# Patient Record
Sex: Male | Born: 1950
Health system: Southern US, Community
[De-identification: ages and names within clinical notes are randomized; demographics above are authoritative.]

## PROBLEM LIST (undated history)

## (undated) DIAGNOSIS — R059 Cough, unspecified: Secondary | ICD-10-CM

## (undated) DIAGNOSIS — C801 Malignant (primary) neoplasm, unspecified: Secondary | ICD-10-CM

## (undated) DIAGNOSIS — R05 Cough: Secondary | ICD-10-CM

## (undated) DIAGNOSIS — R0602 Shortness of breath: Secondary | ICD-10-CM

## (undated) DIAGNOSIS — J189 Pneumonia, unspecified organism: Secondary | ICD-10-CM

## (undated) HISTORY — PX: INTRAOCULAR LENS IMPLANT, SECONDARY: SHX1842

## (undated) HISTORY — PX: SHOULDER SURGERY: SHX246

## (undated) HISTORY — PX: CATARACT EXTRACTION: SUR2

## (undated) HISTORY — PX: KNEE SURGERY: SHX244

## (undated) HISTORY — PX: COLON SURGERY: SHX602

---

## 1971-11-28 HISTORY — PX: KNEE SURGERY: SHX244

## 1998-11-19 ENCOUNTER — Encounter: Payer: Self-pay | Admitting: Emergency Medicine

## 1998-11-19 ENCOUNTER — Emergency Department (HOSPITAL_COMMUNITY): Admission: EM | Admit: 1998-11-19 | Discharge: 1998-11-19 | Payer: Self-pay | Admitting: Emergency Medicine

## 1999-04-02 ENCOUNTER — Emergency Department (HOSPITAL_COMMUNITY): Admission: EM | Admit: 1999-04-02 | Discharge: 1999-04-02 | Payer: Self-pay | Admitting: Emergency Medicine

## 1999-04-04 ENCOUNTER — Encounter (HOSPITAL_COMMUNITY): Admission: RE | Admit: 1999-04-04 | Discharge: 1999-07-03 | Payer: Self-pay | Admitting: Psychiatry

## 1999-06-12 ENCOUNTER — Inpatient Hospital Stay (HOSPITAL_COMMUNITY): Admission: EM | Admit: 1999-06-12 | Discharge: 1999-06-15 | Payer: Self-pay | Admitting: Emergency Medicine

## 1999-07-22 ENCOUNTER — Inpatient Hospital Stay (HOSPITAL_COMMUNITY): Admission: EM | Admit: 1999-07-22 | Discharge: 1999-07-26 | Payer: Self-pay | Admitting: Emergency Medicine

## 2001-12-26 ENCOUNTER — Ambulatory Visit (HOSPITAL_COMMUNITY): Admission: RE | Admit: 2001-12-26 | Discharge: 2001-12-26 | Payer: Self-pay | Admitting: Neurology

## 2002-01-01 ENCOUNTER — Encounter: Admission: RE | Admit: 2002-01-01 | Discharge: 2002-01-01 | Payer: Self-pay | Admitting: Neurology

## 2002-01-01 ENCOUNTER — Encounter: Payer: Self-pay | Admitting: Neurology

## 2007-11-28 HISTORY — PX: SHOULDER SURGERY: SHX246

## 2011-10-28 DIAGNOSIS — J189 Pneumonia, unspecified organism: Secondary | ICD-10-CM

## 2011-10-28 HISTORY — DX: Pneumonia, unspecified organism: J18.9

## 2011-11-24 ENCOUNTER — Emergency Department (HOSPITAL_COMMUNITY)
Admission: EM | Admit: 2011-11-24 | Discharge: 2011-11-24 | Disposition: A | Discharge status: Home / Self Care | Payer: Self-pay | Attending: Emergency Medicine | Admitting: Emergency Medicine

## 2011-11-24 ENCOUNTER — Other Ambulatory Visit: Payer: Self-pay

## 2011-11-24 ENCOUNTER — Emergency Department (HOSPITAL_COMMUNITY): Payer: Self-pay

## 2011-11-24 DIAGNOSIS — R0602 Shortness of breath: Secondary | ICD-10-CM | POA: Insufficient documentation

## 2011-11-24 DIAGNOSIS — R079 Chest pain, unspecified: Secondary | ICD-10-CM | POA: Insufficient documentation

## 2011-11-24 DIAGNOSIS — R05 Cough: Secondary | ICD-10-CM | POA: Insufficient documentation

## 2011-11-24 DIAGNOSIS — J40 Bronchitis, not specified as acute or chronic: Secondary | ICD-10-CM

## 2011-11-24 DIAGNOSIS — R059 Cough, unspecified: Secondary | ICD-10-CM | POA: Insufficient documentation

## 2011-11-24 DIAGNOSIS — F17200 Nicotine dependence, unspecified, uncomplicated: Secondary | ICD-10-CM | POA: Insufficient documentation

## 2011-11-24 DIAGNOSIS — Z79899 Other long term (current) drug therapy: Secondary | ICD-10-CM | POA: Insufficient documentation

## 2011-11-24 MED ORDER — PREDNISONE 10 MG PO TABS: 20.0000 mg | ORAL_TABLET | Freq: Every day | ORAL | Status: DC

## 2011-11-24 MED ORDER — IPRATROPIUM BROMIDE 0.02 % IN SOLN
0.5000 mg | Freq: Once | RESPIRATORY_TRACT | Status: AC
  Administered 2011-11-24: 0.5 mg via RESPIRATORY_TRACT
  Filled 2011-11-24: qty 2.5

## 2011-11-24 MED ORDER — HYDROCOD POLST-CHLORPHEN POLST 10-8 MG/5ML PO LQCR: 5.0000 mL | Freq: Two times a day (BID) | ORAL | Status: DC | PRN

## 2011-11-24 MED ORDER — ALBUTEROL SULFATE HFA 108 (90 BASE) MCG/ACT IN AERS: 1.0000 | INHALATION_SPRAY | Freq: Four times a day (QID) | RESPIRATORY_TRACT | Status: DC | PRN

## 2011-11-24 MED ORDER — ALBUTEROL SULFATE (5 MG/ML) 0.5% IN NEBU
5.0000 mg | INHALATION_SOLUTION | Freq: Once | RESPIRATORY_TRACT | Status: AC
  Administered 2011-11-24: 5 mg via RESPIRATORY_TRACT
  Filled 2011-11-24: qty 1

## 2011-11-24 MED ORDER — PREDNISONE 20 MG PO TABS
60.0000 mg | ORAL_TABLET | Freq: Once | ORAL | Status: AC
  Administered 2011-11-24: 60 mg via ORAL
  Filled 2011-11-24: qty 3

## 2011-11-24 MED ORDER — MOXIFLOXACIN HCL 400 MG PO TABS: 400.0000 mg | ORAL_TABLET | Freq: Every day | ORAL | Status: AC

## 2011-11-24 NOTE — ED Notes (Signed)
RT in giving breathing treatment

## 2011-11-24 NOTE — ED Notes (Signed)
[id number]<AV> Expected date:<BR> Expected time:<BR> Means of arrival:<BR> Comments:<BR> Hold for triage RM 4

## 2011-11-24 NOTE — ED Notes (Signed)
Patient transported to X-ray 

## 2011-11-24 NOTE — ED Provider Notes (Signed)
History     CSN: [id number]  Arrival date & time 11/24/11  1814   First MD Initiated Contact with Patient 11/24/11 1927      Chief Complaint  Patient presents with  . Shortness of Breath    since sunday.  had fevers/chills.  . Cough    dry cough  . Chest Pain    burning and tightness.    (Consider location/radiation/quality/duration/timing/severity/associated sxs/prior treatment) The history is provided by the patient.   patient here with cough x5 days. Cough has been productive of green sputum. Low-grade temperature noted. Denies vomiting or diarrhea. Patient has been using over-the-counter medications without resolution of his symptoms. Cough is worse with exertion and associated with chest congestion however he denies any anginal type chest pain. History reviewed. No pertinent past medical history.  Past Surgical History  Procedure Date  . Knee surgery   . Shoulder surgery   . Cataract extraction   . Intraocular lens implant, secondary     History reviewed. No pertinent family history.  History  Substance Use Topics  . Smoking status: Current Everyday Smoker -- 2.0 packs/day for 40 years    Types: Cigars  . Smokeless tobacco: Not on file  . Alcohol Use: No      Review of Systems  All other systems reviewed and are negative.    Allergies  Review of patient's allergies indicates no known allergies.  Home Medications   Current Outpatient Rx  Name Route Sig Dispense Refill  . GUAIFENESIN ER 600 MG PO TB12 Oral Take 1,200 mg by mouth 2 (two) times daily.      . GUAIFENESIN 100 MG/5ML PO LIQD Oral Take 400 mg by mouth 3 (three) times daily as needed.      . ONE-A-DAY MENS 50+ ADVANTAGE PO Oral Take 1 tablet by mouth.      . TYLENOL COLD/FLU SEVERE DAY PO Oral Take 2 capsules by mouth.        BP 154/96  Pulse 103  Temp(Src) 98.8 F (37.1 C) (Oral)  Resp 24  Ht 5\' 8"  (1.727 m)  Wt 140 lb (63.504 kg)  BMI 21.29 kg/m2  SpO2 92%  Physical Exam    Nursing note and vitals reviewed. Constitutional: He is oriented to person, place, and time. He appears well-developed and well-nourished.  Non-toxic appearance. No distress.  HENT:  Head: Normocephalic and atraumatic.  Eyes: Conjunctivae, EOM and lids are normal. Pupils are equal, round, and reactive to light.  Neck: Normal range of motion. Neck supple. No tracheal deviation present. No mass present.  Cardiovascular: Regular rhythm and normal heart sounds.  Tachycardia present.  Exam reveals no gallop.   No murmur heard. Pulmonary/Chest: Effort normal. No stridor. No respiratory distress. He has decreased breath sounds. He has wheezes. He has no rhonchi. He has no rales.  Abdominal: Soft. Normal appearance and bowel sounds are normal. He exhibits no distension. There is no tenderness. There is no rebound and no CVA tenderness.  Musculoskeletal: Normal range of motion. He exhibits no edema and no tenderness.  Neurological: He is alert and oriented to person, place, and time. He has normal strength. No cranial nerve deficit or sensory deficit. GCS eye subscore is 4. GCS verbal subscore is 5. GCS motor subscore is 6.  Skin: Skin is warm and dry. No abrasion and no rash noted.  Psychiatric: He has a normal mood and affect. His speech is normal and behavior is normal.    ED Course  Procedures (including  critical care time)  Labs Reviewed - No data to display No results found.   No diagnosis found.    MDM  Patient given albuterol with Atrovent. Chest x-ray noted. Will treat patient for bronchitis/early pneumonia        Toy Baker, MD 11/24/11 2049

## 2012-04-15 ENCOUNTER — Emergency Department (HOSPITAL_COMMUNITY): Payer: Self-pay | Admitting: Anesthesiology

## 2012-04-15 ENCOUNTER — Encounter (HOSPITAL_COMMUNITY): Payer: Self-pay | Admitting: Emergency Medicine

## 2012-04-15 ENCOUNTER — Encounter (HOSPITAL_COMMUNITY)
Admission: EM | Disposition: A | Discharge status: Home Health | Payer: Self-pay | Source: Home / Self Care | Attending: General Surgery

## 2012-04-15 ENCOUNTER — Encounter (HOSPITAL_COMMUNITY): Payer: Self-pay | Admitting: Anesthesiology

## 2012-04-15 ENCOUNTER — Emergency Department (HOSPITAL_COMMUNITY): Payer: Self-pay

## 2012-04-15 ENCOUNTER — Inpatient Hospital Stay (HOSPITAL_COMMUNITY)
Admission: EM | Admit: 2012-04-15 | Discharge: 2012-04-22 | DRG: 331 | Disposition: A | Discharge status: Home Health | Payer: Self-pay | Attending: General Surgery | Admitting: General Surgery

## 2012-04-15 DIAGNOSIS — K631 Perforation of intestine (nontraumatic): Secondary | ICD-10-CM

## 2012-04-15 DIAGNOSIS — Z832 Family history of diseases of the blood and blood-forming organs and certain disorders involving the immune mechanism: Secondary | ICD-10-CM

## 2012-04-15 DIAGNOSIS — Z8701 Personal history of pneumonia (recurrent): Secondary | ICD-10-CM

## 2012-04-15 DIAGNOSIS — J45909 Unspecified asthma, uncomplicated: Secondary | ICD-10-CM | POA: Diagnosis present

## 2012-04-15 DIAGNOSIS — K5289 Other specified noninfective gastroenteritis and colitis: Secondary | ICD-10-CM | POA: Diagnosis present

## 2012-04-15 DIAGNOSIS — K389 Disease of appendix, unspecified: Secondary | ICD-10-CM

## 2012-04-15 DIAGNOSIS — K5732 Diverticulitis of large intestine without perforation or abscess without bleeding: Secondary | ICD-10-CM

## 2012-04-15 DIAGNOSIS — K37 Unspecified appendicitis: Secondary | ICD-10-CM | POA: Diagnosis present

## 2012-04-15 HISTORY — PX: LAPAROTOMY: SHX154

## 2012-04-15 HISTORY — PX: COLOSTOMY: SHX63

## 2012-04-15 HISTORY — DX: Pneumonia, unspecified organism: J18.9

## 2012-04-15 HISTORY — PX: PARTIAL COLECTOMY: SHX5273

## 2012-04-15 HISTORY — PX: APPENDECTOMY: SHX54

## 2012-04-15 LAB — CBC
HCT: 45.7 % (ref 39.0–52.0)
Hemoglobin: 16.3 g/dL (ref 13.0–17.0)
MCH: 33.4 pg (ref 26.0–34.0)
MCHC: 35.7 g/dL (ref 30.0–36.0)
MCV: 93.6 fL (ref 78.0–100.0)
Platelets: 247 10*3/uL (ref 150–400)
RBC: 4.88 MIL/uL (ref 4.22–5.81)
RDW: 12.9 % (ref 11.5–15.5)
WBC: 8.8 10*3/uL (ref 4.0–10.5)

## 2012-04-15 LAB — COMPREHENSIVE METABOLIC PANEL
ALT: 10 U/L (ref 0–53)
AST: 16 U/L (ref 0–37)
Albumin: 3.5 g/dL (ref 3.5–5.2)
Alkaline Phosphatase: 71 U/L (ref 39–117)
BUN: 12 mg/dL (ref 6–23)
CO2: 27 mEq/L (ref 19–32)
Calcium: 8.7 mg/dL (ref 8.4–10.5)
Chloride: 97 mEq/L (ref 96–112)
Creatinine, Ser: 0.77 mg/dL (ref 0.50–1.35)
GFR calc Af Amer: >90 mL/min (ref >90)
GFR calc non Af Amer: >90 mL/min (ref >90)
Glucose, Bld: 122 mg/dL — ABNORMAL HIGH (ref 70–99)
Potassium: 4.6 mEq/L (ref 3.5–5.1)
Sodium: 135 mEq/L (ref 135–145)
Total Bilirubin: 0.5 mg/dL (ref 0.3–1.2)
Total Protein: 7 g/dL (ref 6.0–8.3)

## 2012-04-15 LAB — URINE MICROSCOPIC-ADD ON

## 2012-04-15 LAB — DIFFERENTIAL
Basophils Absolute: 0.1 10*3/uL (ref 0.0–0.1)
Basophils Relative: 1 % (ref 0–1)
Eosinophils Absolute: 0 10*3/uL (ref 0.0–0.7)
Eosinophils Relative: 0 % (ref 0–5)
Lymphocytes Relative: 10 % — ABNORMAL LOW (ref 12–46)
Lymphs Abs: 0.9 10*3/uL (ref 0.7–4.0)
Monocytes Absolute: 1 10*3/uL (ref 0.1–1.0)
Monocytes Relative: 11 % (ref 3–12)
Neutro Abs: 6.8 10*3/uL (ref 1.7–7.7)
Neutrophils Relative %: 78 % — ABNORMAL HIGH (ref 43–77)

## 2012-04-15 LAB — URINALYSIS, ROUTINE W REFLEX MICROSCOPIC
Glucose, UA: NEGATIVE mg/dL
Hgb urine dipstick: NEGATIVE
Protein, ur: NEGATIVE mg/dL
Specific Gravity, Urine: 1.024 (ref 1.005–1.030)
pH: 6 (ref 5.0–8.0)

## 2012-04-15 LAB — LIPASE, BLOOD: Lipase: 12 U/L (ref 11–59)

## 2012-04-15 SURGERY — LAPAROTOMY, EXPLORATORY
Anesthesia: General | Site: Abdomen

## 2012-04-15 MED ORDER — MORPHINE SULFATE (PF) 1 MG/ML IV SOLN
INTRAVENOUS | Status: DC
  Administered 2012-04-15: via INTRAVENOUS
  Administered 2012-04-16: 1.5 mg via INTRAVENOUS
  Administered 2012-04-16: 3 mg via INTRAVENOUS
  Administered 2012-04-16: 10.3 mg via INTRAVENOUS
  Administered 2012-04-16: 9 mg via INTRAVENOUS
  Administered 2012-04-16: 1.5 mg via INTRAVENOUS
  Administered 2012-04-17: 01:00:00 via INTRAVENOUS
  Administered 2012-04-17: 1.5 mg via INTRAVENOUS
  Administered 2012-04-17: 10 mg via INTRAVENOUS
  Administered 2012-04-17: 7.5 mg via INTRAVENOUS
  Administered 2012-04-17: 6 mg via INTRAVENOUS
  Administered 2012-04-17 (×2): 4.5 mg via INTRAVENOUS
  Administered 2012-04-18 (×2): via INTRAVENOUS
  Administered 2012-04-18: 7.5 mg via INTRAVENOUS
  Administered 2012-04-18: 6.5 mg via INTRAVENOUS
  Administered 2012-04-18 (×2): 6 mg via INTRAVENOUS
  Administered 2012-04-18: 7.5 mg via INTRAVENOUS
  Administered 2012-04-19: 4.5 mg via INTRAVENOUS
  Administered 2012-04-19: 6 mg via INTRAVENOUS
  Filled 2012-04-15 (×5): qty 25

## 2012-04-15 MED ORDER — FENTANYL CITRATE 0.05 MG/ML IJ SOLN
INTRAMUSCULAR | Status: AC
  Filled 2012-04-15: qty 2

## 2012-04-15 MED ORDER — ONDANSETRON HCL 4 MG/2ML IJ SOLN
4.0000 mg | Freq: Once | INTRAMUSCULAR | Status: AC
  Administered 2012-04-15: 4 mg via INTRAVENOUS
  Filled 2012-04-15: qty 2

## 2012-04-15 MED ORDER — LIDOCAINE HCL (CARDIAC) 20 MG/ML IV SOLN
INTRAVENOUS | Status: DC | PRN
  Administered 2012-04-15: 50 mg via INTRAVENOUS

## 2012-04-15 MED ORDER — ONDANSETRON 8 MG PO TBDP
8.0000 mg | ORAL_TABLET | Freq: Once | ORAL | Status: AC
  Administered 2012-04-15: 8 mg via ORAL
  Filled 2012-04-15: qty 1

## 2012-04-15 MED ORDER — IOHEXOL 300 MG/ML  SOLN
100.0000 mL | Freq: Once | INTRAMUSCULAR | Status: AC | PRN
  Administered 2012-04-15: 100 mL via INTRAVENOUS

## 2012-04-15 MED ORDER — KCL IN DEXTROSE-NACL 20-5-0.9 MEQ/L-%-% IV SOLN
INTRAVENOUS | Status: AC
  Filled 2012-04-15: qty 1000

## 2012-04-15 MED ORDER — LACTATED RINGERS IV SOLN
INTRAVENOUS | Status: DC | PRN
  Administered 2012-04-15 (×3): via INTRAVENOUS

## 2012-04-15 MED ORDER — PROMETHAZINE HCL 25 MG/ML IJ SOLN: 6.2500 mg | INTRAMUSCULAR | Status: DC | PRN

## 2012-04-15 MED ORDER — SODIUM CHLORIDE 0.9 % IV SOLN
1.0000 g | INTRAVENOUS | Status: AC
  Administered 2012-04-15: 1 g via INTRAVENOUS

## 2012-04-15 MED ORDER — LACTATED RINGERS IV SOLN: INTRAVENOUS | Status: DC

## 2012-04-15 MED ORDER — SODIUM CHLORIDE 0.9 % IV SOLN
INTRAVENOUS | Status: AC
  Filled 2012-04-15: qty 1

## 2012-04-15 MED ORDER — SUCCINYLCHOLINE CHLORIDE 20 MG/ML IJ SOLN
INTRAMUSCULAR | Status: DC | PRN
  Administered 2012-04-15: 100 mg via INTRAVENOUS

## 2012-04-15 MED ORDER — MORPHINE SULFATE 4 MG/ML IJ SOLN
6.0000 mg | Freq: Once | INTRAMUSCULAR | Status: AC
  Administered 2012-04-15: 6 mg via INTRAVENOUS
  Filled 2012-04-15: qty 2

## 2012-04-15 MED ORDER — KCL IN DEXTROSE-NACL 20-5-0.9 MEQ/L-%-% IV SOLN
INTRAVENOUS | Status: DC
  Administered 2012-04-16: 125 mL/h via INTRAVENOUS
  Administered 2012-04-16 (×2): via INTRAVENOUS
  Administered 2012-04-17: 125 mL via INTRAVENOUS
  Administered 2012-04-17: 06:00:00 via INTRAVENOUS
  Filled 2012-04-15 (×8): qty 1000

## 2012-04-15 MED ORDER — ONDANSETRON HCL 4 MG/2ML IJ SOLN
INTRAMUSCULAR | Status: DC | PRN
  Administered 2012-04-15: 4 mg via INTRAVENOUS

## 2012-04-15 MED ORDER — HYDROMORPHONE HCL PF 1 MG/ML IJ SOLN
1.0000 mg | Freq: Once | INTRAMUSCULAR | Status: AC
  Administered 2012-04-15: 1 mg via INTRAVENOUS
  Filled 2012-04-15: qty 1

## 2012-04-15 MED ORDER — NEOSTIGMINE METHYLSULFATE 1 MG/ML IJ SOLN
INTRAMUSCULAR | Status: DC | PRN
  Administered 2012-04-15: 4 mg via INTRAVENOUS

## 2012-04-15 MED ORDER — FENTANYL CITRATE 0.05 MG/ML IJ SOLN
INTRAMUSCULAR | Status: DC | PRN
  Administered 2012-04-15: 100 ug via INTRAVENOUS
  Administered 2012-04-15: 50 ug via INTRAVENOUS
  Administered 2012-04-15: 100 ug via INTRAVENOUS
  Administered 2012-04-15: 50 ug via INTRAVENOUS
  Administered 2012-04-15 (×2): 100 ug via INTRAVENOUS

## 2012-04-15 MED ORDER — PROPOFOL 10 MG/ML IV BOLUS
INTRAVENOUS | Status: DC | PRN
  Administered 2012-04-15: 150 mg via INTRAVENOUS

## 2012-04-15 MED ORDER — SODIUM CHLORIDE 0.9 % IV BOLUS (SEPSIS)
1000.0000 mL | Freq: Once | INTRAVENOUS | Status: AC
  Administered 2012-04-15: 1000 mL via INTRAVENOUS

## 2012-04-15 MED ORDER — ROCURONIUM BROMIDE 100 MG/10ML IV SOLN
INTRAVENOUS | Status: DC | PRN
  Administered 2012-04-15: 30 mg via INTRAVENOUS
  Administered 2012-04-15: 10 mg via INTRAVENOUS
  Administered 2012-04-15: 20 mg via INTRAVENOUS

## 2012-04-15 MED ORDER — FENTANYL CITRATE 0.05 MG/ML IJ SOLN
25.0000 ug | INTRAMUSCULAR | Status: DC | PRN
  Administered 2012-04-15 (×2): 50 ug via INTRAVENOUS

## 2012-04-15 MED ORDER — GLYCOPYRROLATE 0.2 MG/ML IJ SOLN
INTRAMUSCULAR | Status: DC | PRN
  Administered 2012-04-15: 0.6 mg via INTRAVENOUS

## 2012-04-15 MED ORDER — MORPHINE SULFATE (PF) 1 MG/ML IV SOLN
INTRAVENOUS | Status: AC
  Filled 2012-04-15: qty 25

## 2012-04-15 SURGICAL SUPPLY — 39 items
APPLICATOR COTTON TIP 6IN STRL (MISCELLANEOUS) ×3 IMPLANT
BANDAGE GAUZE ELAST BULKY 4 IN (GAUZE/BANDAGES/DRESSINGS) ×1 IMPLANT
BLADE EXTENDED COATED 6.5IN (ELECTRODE) IMPLANT
BLADE HEX COATED 2.75 (ELECTRODE) ×3 IMPLANT
CANISTER SUCTION 2500CC (MISCELLANEOUS) ×3 IMPLANT
CLOTH BEACON ORANGE TIMEOUT ST (SAFETY) ×6 IMPLANT
COVER MAYO STAND STRL (DRAPES) ×1 IMPLANT
DRAPE LAPAROSCOPIC ABDOMINAL (DRAPES) ×3 IMPLANT
DRAPE WARM FLUID 44X44 (DRAPE) IMPLANT
ELECT REM PT RETURN 9FT ADLT (ELECTROSURGICAL) ×3
ELECTRODE REM PT RTRN 9FT ADLT (ELECTROSURGICAL) ×2 IMPLANT
GLOVE BIOGEL PI IND STRL 7.0 (GLOVE) ×2 IMPLANT
GLOVE BIOGEL PI INDICATOR 7.0 (GLOVE) ×1
GOWN STRL NON-REIN LRG LVL3 (GOWN DISPOSABLE) ×3 IMPLANT
GOWN STRL REIN XL XLG (GOWN DISPOSABLE) ×6 IMPLANT
KIT BASIN OR (CUSTOM PROCEDURE TRAY) ×3 IMPLANT
NS IRRIG 1000ML POUR BTL (IV SOLUTION) ×3 IMPLANT
PACK GENERAL/GYN (CUSTOM PROCEDURE TRAY) ×3 IMPLANT
RELOAD PROXIMATE 75MM BLUE (ENDOMECHANICALS) ×3 IMPLANT
RELOAD STAPLE 75 3.8 BLU REG (ENDOMECHANICALS) IMPLANT
SEALER TISSUE X1 CVD JAW (INSTRUMENTS) ×1 IMPLANT
SPONGE GAUZE 4X4 12PLY (GAUZE/BANDAGES/DRESSINGS) ×3 IMPLANT
SPONGE LAP 18X18 X RAY DECT (DISPOSABLE) ×1 IMPLANT
STAPLER PROXIMATE 75MM BLUE (STAPLE) ×1 IMPLANT
STAPLER VISISTAT 35W (STAPLE) ×3 IMPLANT
SUCTION POOLE TIP (SUCTIONS) ×1 IMPLANT
SUT PDS AB 1 CTX 36 (SUTURE) IMPLANT
SUT PDS AB 1 TP1 96 (SUTURE) ×2 IMPLANT
SUT PROLENE 0 SH 30 (SUTURE) ×1 IMPLANT
SUT SILK 2 0 (SUTURE) ×3
SUT SILK 2 0 SH CR/8 (SUTURE) ×1 IMPLANT
SUT SILK 2-0 18XBRD TIE 12 (SUTURE) IMPLANT
SUT SILK 3 0 (SUTURE) ×3
SUT SILK 3 0 SH CR/8 (SUTURE) ×1 IMPLANT
SUT SILK 3-0 18XBRD TIE 12 (SUTURE) IMPLANT
TAPE CLOTH SURG 4X10 WHT LF (GAUZE/BANDAGES/DRESSINGS) ×1 IMPLANT
TOWEL OR 17X26 10 PK STRL BLUE (TOWEL DISPOSABLE) ×6 IMPLANT
TRAY FOLEY CATH 14FRSI W/METER (CATHETERS) ×1 IMPLANT
YANKAUER SUCT BULB TIP NO VENT (SUCTIONS) ×1 IMPLANT

## 2012-04-15 NOTE — H&P (Signed)
Christopher Mora is an 61 y.o. male.   Chief Complaint: abdominal pain HPI: patient is a 61 year old male who states that about 5 days ago after a meal he developed some significant aching abdominal discomfort across his lower abdomen. This bothered him for about 2 days and then went away. It was not severe enough to seek attention and there were no associated symptoms. He was feeling well yesterday but awoke this morning about 10:00 with severe abdominal pain across his entire lower and mid abdomen. This was associated with nausea and he has had several episodes of vomiting. He had a soft bowel movement this morning that appeared somewhat black. He has had no melena or hematochezia or hematemesis. He denies fever or chills. He denies chronic dyspeptic symptoms or chronic abdominal or GI complaints of any kind. No fever or chills. No urinary symptoms.  Past Medical History  Diagnosis Date  . Asthma   . Pneumonia     Past Surgical History  Procedure Date  . Knee surgery   . Shoulder surgery   . Cataract extraction   . Intraocular lens implant, secondary   . Knee surgery   . Shoulder surgery    Current Facility-Administered Medications  Medication Dose Route Frequency Provider Last Rate Last Dose  . HYDROmorphone (DILAUDID) injection 1 mg  1 mg Intravenous Once Rolan Bucco, MD   1 mg at 04/15/12 1813  . iohexol (OMNIPAQUE) 300 MG/ML solution 100 mL  100 mL Intravenous Once PRN Medication Radiologist, MD   100 mL at 04/15/12 1812  . morphine 4 MG/ML injection 6 mg  6 mg Intravenous Once Rolan Bucco, MD   6 mg at 04/15/12 1629  . ondansetron (ZOFRAN) injection 4 mg  4 mg Intravenous Once Rolan Bucco, MD   4 mg at 04/15/12 1628  . ondansetron (ZOFRAN-ODT) disintegrating tablet 8 mg  8 mg Oral Once Remi Haggard, NP   8 mg at 04/15/12 1528  . sodium chloride 0.9 % bolus 1,000 mL  1,000 mL Intravenous Once Rolan Bucco, MD   1,000 mL at 04/15/12 1628   Current Outpatient Prescriptions    Medication Sig Dispense Refill  . bismuth subsalicylate (PEPTO BISMOL) 262 MG/15ML suspension Take 15 mLs by mouth every 6 (six) hours as needed. Stomach upset      . Melatonin 5 MG TABS Take 1 tablet by mouth at bedtime.        History reviewed. No pertinent family history. Social History:  reports that he has been smoking Cigars.  He does not have any smokeless tobacco history on file. He reports that he does not drink alcohol or use illicit drugs.  Allergies: No Known Allergies   Results for orders placed during the hospital encounter of 04/15/12 (from the past 48 hour(s))  CBC     Status: Normal   Collection Time   04/15/12  2:45 PM      Component Value Range Comment   WBC 8.8  4.0 - 10.5 (K/uL)    RBC 4.88  4.22 - 5.81 (MIL/uL)    Hemoglobin 16.3  13.0 - 17.0 (g/dL)    HCT 16.1  09.6 - 04.5 (%)    MCV 93.6  78.0 - 100.0 (fL)    MCH 33.4  26.0 - 34.0 (pg)    MCHC 35.7  30.0 - 36.0 (g/dL)    RDW 40.9  81.1 - 91.4 (%)    Platelets 247  150 - 400 (K/uL)   DIFFERENTIAL     Status:  Abnormal   Collection Time   04/15/12  2:45 PM      Component Value Range Comment   Neutrophils Relative 78 (*) 43 - 77 (%)    Lymphocytes Relative 10 (*) 12 - 46 (%)    Monocytes Relative 11  3 - 12 (%)    Eosinophils Relative 0  0 - 5 (%)    Basophils Relative 1  0 - 1 (%)    Neutro Abs 6.8  1.7 - 7.7 (K/uL)    Lymphs Abs 0.9  0.7 - 4.0 (K/uL)    Monocytes Absolute 1.0  0.1 - 1.0 (K/uL)    Eosinophils Absolute 0.0  0.0 - 0.7 (K/uL)    Basophils Absolute 0.1  0.0 - 0.1 (K/uL)    Smear Review PLATELET CLUMPS NOTED ON SMEAR     COMPREHENSIVE METABOLIC PANEL     Status: Abnormal   Collection Time   04/15/12  2:45 PM      Component Value Range Comment   Sodium 135  135 - 145 (mEq/L)    Potassium 4.6  3.5 - 5.1 (mEq/L)    Chloride 97  96 - 112 (mEq/L)    CO2 27  19 - 32 (mEq/L)    Glucose, Bld 122 (*) 70 - 99 (mg/dL)    BUN 12  6 - 23 (mg/dL)    Creatinine, Ser 1.61  0.50 - 1.35 (mg/dL)     Calcium 8.7  8.4 - 10.5 (mg/dL)    Total Protein 7.0  6.0 - 8.3 (g/dL)    Albumin 3.5  3.5 - 5.2 (g/dL)    AST 16  0 - 37 (U/L)    ALT 10  0 - 53 (U/L)    Alkaline Phosphatase 71  39 - 117 (U/L)    Total Bilirubin 0.5  0.3 - 1.2 (mg/dL)    GFR calc non Af Amer >90  >90 (mL/min)    GFR calc Af Amer >90  >90 (mL/min)   LIPASE, BLOOD     Status: Normal   Collection Time   04/15/12  2:45 PM      Component Value Range Comment   Lipase 12  11 - 59 (U/L)   URINALYSIS, ROUTINE W REFLEX MICROSCOPIC     Status: Abnormal   Collection Time   04/15/12  4:53 PM      Component Value Range Comment   Color, Urine AMBER (*) YELLOW  BIOCHEMICALS MAY BE AFFECTED BY COLOR   APPearance CLOUDY (*) CLEAR     Specific Gravity, Urine 1.024  1.005 - 1.030     pH 6.0  5.0 - 8.0     Glucose, UA NEGATIVE  NEGATIVE (mg/dL)    Hgb urine dipstick NEGATIVE  NEGATIVE     Bilirubin Urine SMALL (*) NEGATIVE     Ketones, ur NEGATIVE  NEGATIVE (mg/dL)    Protein, ur NEGATIVE  NEGATIVE (mg/dL)    Urobilinogen, UA 1.0  0.0 - 1.0 (mg/dL)    Nitrite NEGATIVE  NEGATIVE     Leukocytes, UA TRACE (*) NEGATIVE    URINE MICROSCOPIC-ADD ON     Status: Normal   Collection Time   04/15/12  4:53 PM      Component Value Range Comment   Squamous Epithelial / LPF RARE  RARE     Bacteria, UA RARE  RARE     Urine-Other MUCOUS PRESENT      Ct Abdomen Pelvis W Contrast  04/15/2012  *RADIOLOGY REPORT*  Clinical Data: Lower  abdominal pain.  CT ABDOMEN AND PELVIS WITH CONTRAST  Technique:  Multidetector CT imaging of the abdomen and pelvis was performed following the standard protocol during bolus administration of intravenous contrast.  Contrast: OMNIPAQUE IOHEXOL 300 MG/ML  SOLN  Comparison: None.  Findings: There is moderate amount of free intraperitoneal air as well as free fluid throughout the abdomen and pelvis.  Exact level of the bowel perforation is not definitively localized.  The transverse colon is decompressed and somewhat  thickened in appearance.  There also is some thickening evident at the level of the cecum and terminal ileum.  Diffuse diverticulosis of the sigmoid colon present.  No overt extravasation of oral contrast is identified.  The liver, pancreas, gallbladder, adrenal glands, spleen and kidneys are unremarkable. Atherosclerosis of the abdominal aorta and iliac arteries present without evidence of aneurysm.  There likely is significant stenosis of the left external and common femoral arteries secondary to plaque.  No hernias are identified. The bladder is unremarkable.  No significant bony abnormalities.  IMPRESSION:  1.  Free intraperitoneal air and free fluid consistent with bowel perforation.  The exact level perforation is not accurately identified by CT.  Suspect colonic perforation based on imaging findings. 2.  Incidental significant atherosclerotic narrowing of the left external iliac and common femoral arteries.  Critical Value/emergent results were called by telephone at the time of interpretation on 04/15/2012  at 1825 hours  to  Dr. Fredderick Phenix, who verbally acknowledged these results.  Original Report Authenticated By: Reola Calkins, M.D.    Review of Systems  Constitutional: Negative for fever and chills.  Respiratory: Positive for cough. Negative for sputum production, shortness of breath and wheezing.   Cardiovascular: Negative.   Gastrointestinal: Positive for nausea, vomiting and abdominal pain. Negative for blood in stool and melena.  Genitourinary: Negative.   Musculoskeletal: Positive for joint pain.    Blood pressure 140/73, pulse 109, temperature 97.4 F (36.3 C), temperature source Oral, resp. rate 18, SpO2 95.00%. Physical Exam  BP 140/73  Pulse 109  Temp(Src) 97.4 F (36.3 C) (Oral)  Resp 18  SpO2 95% General: Thin somewhat chronically ill-appearing white male who appears uncomfortable Skin: Warm and dry without rash or infection. Some erythema over his abdominal wall  anteriorly but states he was using a heating pad HEENT: No palpable masses or thyromegaly. Sclera nonicteric Lymph nodes: No cervical, supraclavicular, axillary, or inguinal nodes palpable Lungs: Clear equal breath sounds bilaterally. No increased work of breathing or wheezing. Cardiovascular: Regular rate and rhythm. No murmurs. No JVD or edema. Abdomen: Thin. Bowel sounds are hypoactive. There is diffuse marked tenderness and guarding with peritoneal signs that appears greatest in the left lower quadrant. No palpable masses or hepatosplenomegaly. No hernias. Extremities: Superficial varicosities lower extremities without edema or swelling or cyanosis Neurologic: He is alert and fully oriented. Affect normal.  Assessment/Plan Acute abdomen with perforated viscus. Sources not completely clear but seems most consistent with perforated diverticulitis based on the location of his pain and multiple diverticula noted in the colon. Perforation of the small intestine or perforated ulcer are possible as well.  The patient is receiving broad-spectrum IV antibiotics. I recommended proceeding with emergency exploratory laparotomy. I discussed with the patient and his wife the indications for the procedure, its nature, and likely need for diverging colostomy. We discussed risks of anesthesia, bleeding, infection, they understand and agree to proceed.  Christopher Mora 04/15/2012, 6:52 PM

## 2012-04-15 NOTE — ED Notes (Signed)
Abd pain at waist line-- started last Wednesday, had loose stool this am , dark in color. Mucus membranes pink

## 2012-04-15 NOTE — ED Provider Notes (Signed)
History     CSN: [id number]  Arrival date & time 04/15/12  1336   First MD Initiated Contact with Patient 04/15/12 1540      Chief Complaint  Patient presents with  . Abdominal Pain    (Consider location/radiation/quality/duration/timing/severity/associated sxs/prior treatment) HPI Comments: Pt with lower abd pain, started last week in mid abdomen, was coming and going, now constant since last night.  Comes on suddenly and intensely.  No pain is sharp and across both sides of lower abdomen.  No radiation to back or groin.  No urinary symptoms.  No fevers.  No hx of prior abd surgery.    Patient is a 61 y.o. male presenting with abdominal pain. The history is provided by the patient.  Abdominal Pain The primary symptoms of the illness include abdominal pain, nausea and vomiting. The primary symptoms of the illness do not include fever, fatigue, shortness of breath or diarrhea. Episode onset: 5 days ago. The onset of the illness was sudden. The problem has been gradually worsening.  Symptoms associated with the illness do not include chills, diaphoresis, hematuria, frequency or back pain.    Past Medical History  Diagnosis Date  . Asthma   . Pneumonia     Past Surgical History  Procedure Date  . Knee surgery   . Shoulder surgery   . Cataract extraction   . Intraocular lens implant, secondary   . Knee surgery   . Shoulder surgery     History reviewed. No pertinent family history.  History  Substance Use Topics  . Smoking status: Current Everyday Smoker -- 2.0 packs/day for 40 years    Types: Cigars  . Smokeless tobacco: Not on file  . Alcohol Use: No      Review of Systems  Constitutional: Negative for fever, chills, diaphoresis and fatigue.  HENT: Negative for congestion, rhinorrhea and sneezing.   Eyes: Negative.   Respiratory: Negative for cough, chest tightness and shortness of breath.   Cardiovascular: Negative for chest pain and leg swelling.    Gastrointestinal: Positive for nausea, vomiting and abdominal pain. Negative for diarrhea and blood in stool.  Genitourinary: Negative for frequency, hematuria, flank pain and difficulty urinating.  Musculoskeletal: Negative for back pain and arthralgias.  Skin: Negative for rash.  Neurological: Negative for dizziness, speech difficulty, weakness, numbness and headaches.    Allergies  Review of patient's allergies indicates no known allergies.  Home Medications   Current Outpatient Rx  Name Route Sig Dispense Refill  . BISMUTH SUBSALICYLATE 262 MG/15ML PO SUSP Oral Take 15 mLs by mouth every 6 (six) hours as needed. Stomach upset    . MELATONIN 5 MG PO TABS Oral Take 1 tablet by mouth at bedtime.      BP 140/73  Pulse 109  Temp(Src) 97.4 F (36.3 C) (Oral)  Resp 18  SpO2 95%  Physical Exam  Constitutional: He is oriented to person, place, and time. He appears well-developed and well-nourished.  HENT:  Head: Normocephalic and atraumatic.  Eyes: Pupils are equal, round, and reactive to light.  Neck: Normal range of motion. Neck supple.  Cardiovascular: Normal rate, regular rhythm and normal heart sounds.   Pulmonary/Chest: Effort normal and breath sounds normal. No respiratory distress. He has no wheezes. He has no rales. He exhibits no tenderness.  Abdominal: Soft. Bowel sounds are normal. There is tenderness. There is guarding. There is no rebound.       Moderate TTP across lower abdomen.  Neg heel tap/psoas/obturator.  Musculoskeletal:  Normal range of motion. He exhibits no edema.  Lymphadenopathy:    He has no cervical adenopathy.  Neurological: He is alert and oriented to person, place, and time.  Skin: Skin is warm and dry. No rash noted.  Psychiatric: He has a normal mood and affect.    ED Course  Procedures (including critical care time)  Results for orders placed during the hospital encounter of 04/15/12  CBC      Component Value Range   WBC 8.8  4.0 - 10.5  (K/uL)   RBC 4.88  4.22 - 5.81 (MIL/uL)   Hemoglobin 16.3  13.0 - 17.0 (g/dL)   HCT 78.2  95.6 - 21.3 (%)   MCV 93.6  78.0 - 100.0 (fL)   MCH 33.4  26.0 - 34.0 (pg)   MCHC 35.7  30.0 - 36.0 (g/dL)   RDW 08.6  57.8 - 46.9 (%)   Platelets 247  150 - 400 (K/uL)  DIFFERENTIAL      Component Value Range   Neutrophils Relative 78 (*) 43 - 77 (%)   Lymphocytes Relative 10 (*) 12 - 46 (%)   Monocytes Relative 11  3 - 12 (%)   Eosinophils Relative 0  0 - 5 (%)   Basophils Relative 1  0 - 1 (%)   Neutro Abs 6.8  1.7 - 7.7 (K/uL)   Lymphs Abs 0.9  0.7 - 4.0 (K/uL)   Monocytes Absolute 1.0  0.1 - 1.0 (K/uL)   Eosinophils Absolute 0.0  0.0 - 0.7 (K/uL)   Basophils Absolute 0.1  0.0 - 0.1 (K/uL)   Smear Review PLATELET CLUMPS NOTED ON SMEAR    COMPREHENSIVE METABOLIC PANEL      Component Value Range   Sodium 135  135 - 145 (mEq/L)   Potassium 4.6  3.5 - 5.1 (mEq/L)   Chloride 97  96 - 112 (mEq/L)   CO2 27  19 - 32 (mEq/L)   Glucose, Bld 122 (*) 70 - 99 (mg/dL)   BUN 12  6 - 23 (mg/dL)   Creatinine, Ser 6.29  0.50 - 1.35 (mg/dL)   Calcium 8.7  8.4 - 52.8 (mg/dL)   Total Protein 7.0  6.0 - 8.3 (g/dL)   Albumin 3.5  3.5 - 5.2 (g/dL)   AST 16  0 - 37 (U/L)   ALT 10  0 - 53 (U/L)   Alkaline Phosphatase 71  39 - 117 (U/L)   Total Bilirubin 0.5  0.3 - 1.2 (mg/dL)   GFR calc non Af Amer >90  >90 (mL/min)   GFR calc Af Amer >90  >90 (mL/min)  URINALYSIS, ROUTINE W REFLEX MICROSCOPIC      Component Value Range   Color, Urine AMBER (*) YELLOW    APPearance CLOUDY (*) CLEAR    Specific Gravity, Urine 1.024  1.005 - 1.030    pH 6.0  5.0 - 8.0    Glucose, UA NEGATIVE  NEGATIVE (mg/dL)   Hgb urine dipstick NEGATIVE  NEGATIVE    Bilirubin Urine SMALL (*) NEGATIVE    Ketones, ur NEGATIVE  NEGATIVE (mg/dL)   Protein, ur NEGATIVE  NEGATIVE (mg/dL)   Urobilinogen, UA 1.0  0.0 - 1.0 (mg/dL)   Nitrite NEGATIVE  NEGATIVE    Leukocytes, UA TRACE (*) NEGATIVE   LIPASE, BLOOD      Component Value  Range   Lipase 12  11 - 59 (U/L)  URINE MICROSCOPIC-ADD ON      Component Value Range   Squamous Epithelial /  LPF RARE  RARE    Bacteria, UA RARE  RARE    Urine-Other MUCOUS PRESENT     Ct Abdomen Pelvis W Contrast  04/15/2012  *RADIOLOGY REPORT*  Clinical Data: Lower abdominal pain.  CT ABDOMEN AND PELVIS WITH CONTRAST  Technique:  Multidetector CT imaging of the abdomen and pelvis was performed following the standard protocol during bolus administration of intravenous contrast.  Contrast: OMNIPAQUE IOHEXOL 300 MG/ML  SOLN  Comparison: None.  Findings: There is moderate amount of free intraperitoneal air as well as free fluid throughout the abdomen and pelvis.  Exact level of the bowel perforation is not definitively localized.  The transverse colon is decompressed and somewhat thickened in appearance.  There also is some thickening evident at the level of the cecum and terminal ileum.  Diffuse diverticulosis of the sigmoid colon present.  No overt extravasation of oral contrast is identified.  The liver, pancreas, gallbladder, adrenal glands, spleen and kidneys are unremarkable. Atherosclerosis of the abdominal aorta and iliac arteries present without evidence of aneurysm.  There likely is significant stenosis of the left external and common femoral arteries secondary to plaque.  No hernias are identified. The bladder is unremarkable.  No significant bony abnormalities.  IMPRESSION:  1.  Free intraperitoneal air and free fluid consistent with bowel perforation.  The exact level perforation is not accurately identified by CT.  Suspect colonic perforation based on imaging findings. 2.  Incidental significant atherosclerotic narrowing of the left external iliac and common femoral arteries.  Critical Value/emergent results were called by telephone at the time of interpretation on 04/15/2012  at 1825 hours  to  Dr. Fredderick Phenix, who verbally acknowledged these results.  Original Report Authenticated By: Reola Calkins, M.D.      1. Bowel perforation       MDM  Pt with bowel perforation.  Pain controlled after pain meds.  Discussed with [name] with surgery who will admit pt        Rolan Bucco, MD 04/15/12 1840

## 2012-04-15 NOTE — ED Notes (Signed)
Pt. Going to surgery.  O.R. Called and said they would do the consents when he gets to surgery.

## 2012-04-15 NOTE — Transfer of Care (Signed)
Immediate Anesthesia Transfer of Care Note  Patient: Christopher Mora  Procedure(s) Performed: Procedure(s) (LRB): EXPLORATORY LAPAROTOMY (N/A) COLOSTOMY () PARTIAL COLECTOMY () APPENDECTOMY ()  Patient Location: PACU  Anesthesia Type: General  Level of Consciousness: alert , sedated and patient cooperative  Airway & Oxygen Therapy: Patient Spontanous Breathing and Patient connected to face mask oxygen  Post-op Assessment: Report given to PACU RN and Post -op Vital signs reviewed and stable  Post vital signs: Reviewed and stable  Complications: No apparent anesthesia complications

## 2012-04-15 NOTE — Op Note (Signed)
Preoperative Diagnosis: Bowel perforation [569.83] abdominal apin/n/v  Postoprative Diagnosis: perforated sigmoid diverticulitis  Procedure: Procedure(s): EXPLORATORY LAPAROTOMY COLOSTOMY PARTIAL COLECTOMY APPENDECTOMY   Surgeon: Glenna Fellows T   Assistants: Emelia Loron M.D.  Anesthesia:  General endotracheal anesthesiaDiagnos  Indications:   Patient is a 61 year old male who presents with acute severe diffuse abdominal pain of approximately 12 hours duration. He has diffuse tenderness and guarding on exam and CT scan has shown a large amount of free air and fluid throughout his abdomen with extensive diverticulosis of the colon. He has been having some intermittent mid abdominal pain for at least a week. I recommended proceeding with emergency exploratory laparotomy for probable perforated diverticulitis. I discussed the indications for the procedure and its nature as well as risks of anesthetic complications, bleeding, infection, and likely need for temporary colostomy. He understands and agrees.  Procedure Detail:  Patient is brought to the operating room and placed in the supine position on the operating table and general endotracheal anesthesia induced. Nasogastric tube and Foley catheter were placed. PAS were in place. He received broad-spectrum preoperative IV antibiotics. The abdomen was widely sterilely prepped and draped. I initially made a small incision in the midline beneath the umbilicus and dissection was carried down through the subcutaneous tissue and midline fascia using cautery and the peritoneum entered under direct vision. There was copious green purulent fluid with exudate over bowel loops and palpating through the small incision I was able to feel a definite inflammatory process in the left lower quadrant. I then extended the incision down toward the pubis and up to the umbilicus. A Balfour retractor was placed. There were extensive inflammatory interloop  small bowel adhesions with exudate were carefully broken up. There was marked diverticulosis of the sigmoid colon with at least 2 very large, approximately 3-4 cm, hard enlarged diverticula. As I mobilized the small intestine there was an area of the mid ileum that was densely adherent down to the sigmoid colon and to the right lateral pelvic sidewall. Under direct vision using sharp and careful blunt dissection I mobilized the small bowel up out of the pelvis. It was severely secondarily inflamed but otherwise appeared intrinsically normal. The appendix was also densely adherent to down to this process and this was sharply and bluntly mobilized up out of the way and finally some dense chronic adhesions to the right lateral sidewall were carefully bluntly divided at which point the sigmoid could be mobilized up out of the pelvis. Again noted was extensive diverticulosis and there was an area of dense exudate and thickening at the mid sigmoid which was likely the point of perforation although no definite opening could be seen. The remainder of the colon had some scattered diverticulosis but was otherwise normal. There was some purulent fluid in the upper abdomen in the subdiaphragmatic spaces but the stomach, liver, duodenum, and proximal small bowel all appeared normal. The small bowel was packed out of the pelvis and the sigmoid colon further mobilized dividing lateral peritoneal attachments along the left pelvis and left lower abdomen. There was a good segment of distal rectosigmoid that was secondarily inflamed but otherwise normal in the distal point of resection was chosen which was at about the pelvic brim. This was cleared of mesentery and divided with the GIA 75 mm stapler. The proximal point of resection was also chosen at soft normal bowel and was similarly divided. The mesentery of the involved segment was then divided with the super jaw and the specimen removed. I  did identify the left ureter and the  dissection stayed up out of the retroperitoneum. The appendix was markedly secondarily inflamed and this was mobilized dividing the mesial appendix with the super jaw and then the appendix was separated from the tip of the cecum with an additional firing of the GIA. The small bowel was then carefully run from the ligament of Treitz down to the ileocecal valve and there were areas of edema and secondary inflammation. The serosa appeared possibly a little thin and at the area where the dense adhesions in the pelvis and this was oversewn with several 3-0 silk sutures. The abdomen was then thoroughly irrigated with multiple liters of warm saline. A point for the colostomy was chosen in the left lower quadrant and a skin button excised. The fascia was incised in a cruciate fashion and the peritoneum incised and the stoma site dilated to 2 fingers. The sigmoid colon was able to be brought out easily with no tension. It was sutured to the level of the fascia with interrupted 3-0 Vicryl. The abdomen was inspected for hemostasis and the viscera were returned to their anatomic position. The midline fascia was closed using running looped #1 PDS from either end of the incision and tied centrally. The staple line at the ostomy was excised and the stoma matured and everted with interrupted 3-0 Vicryl. The midline incision was packed with moist saline gauze. Sponge needle and instrument counts were correct. Ostomy device was placed and the patient taken to recovery in good condition.  Findings: Perforated sigmoid diverticulitis  Estimated Blood Loss:  less than 100 mL         Drains: none  Blood Given: none          Specimens: sigmoid colon and appendix        Complications:  * No complications entered in OR log *         Disposition: PACU - hemodynamically stable.         Condition: stable  Mariella Saa MD, FACS  04/15/2012, 10:59 PM

## 2012-04-15 NOTE — Progress Notes (Signed)
CM spoke with pt who confirms self pay [facility] resident with no have a pcp. Discussed the importance of a pcp for f/u. Reviewed Health connect number to assist with finding self pay provider close to pt's residence. Reviewed resources for [facility], general medial clinics, medications-[email or web address], housing, DSS, health Department and other resources in [employer] including crisis programs Pt voiced understanding and appreciation of resources provided Answered question about DSS -Personal assistant, [employer], financial assistance resources.  Sandre Kitty emergency contact at bedside

## 2012-04-15 NOTE — Anesthesia Preprocedure Evaluation (Signed)
Anesthesia Evaluation  Patient identified by MRN, date of birth, ID band Patient awake    Reviewed: Allergy & Precautions, H&P , NPO status , Patient's Chart, lab work & pertinent test results  Airway Mallampati: II TM Distance: >3 FB Neck ROM: Full    Dental  (+) Teeth Intact and Dental Advisory Given   Pulmonary asthma , pneumonia , former smoker breath sounds clear to auscultation  Pulmonary exam normal       Cardiovascular negative cardio ROS  Rhythm:Regular Rate:Normal     Neuro/Psych negative neurological ROS  negative psych ROS   GI/Hepatic negative GI ROS, Neg liver ROS,   Endo/Other  negative endocrine ROS  Renal/GU negative Renal ROS  negative genitourinary   Musculoskeletal negative musculoskeletal ROS (+)   Abdominal   Peds negative pediatric ROS (+)  Hematology negative hematology ROS (+)   Anesthesia Other Findings   Reproductive/Obstetrics negative OB ROS                           Anesthesia Physical Anesthesia Plan  ASA: II and Emergent  Anesthesia Plan: General   Post-op Pain Management:    Induction: Intravenous, Rapid sequence and Cricoid pressure planned  Airway Management Planned: Oral ETT  Additional Equipment:   Intra-op Plan:   Post-operative Plan: Extubation in OR  Informed Consent: I have reviewed the patients History and Physical, chart, labs and discussed the procedure including the risks, benefits and alternatives for the proposed anesthesia with the patient or authorized representative who has indicated his/her understanding and acceptance.   Dental advisory given  Plan Discussed with: CRNA  Anesthesia Plan Comments:         Anesthesia Quick Evaluation

## 2012-04-16 ENCOUNTER — Encounter (HOSPITAL_COMMUNITY): Payer: Self-pay

## 2012-04-16 LAB — CBC
HCT: 43 % (ref 39.0–52.0)
Hemoglobin: 14.8 g/dL (ref 13.0–17.0)
MCH: 32.2 pg (ref 26.0–34.0)
MCHC: 34.4 g/dL (ref 30.0–36.0)
RBC: 4.59 MIL/uL (ref 4.22–5.81)

## 2012-04-16 LAB — BASIC METABOLIC PANEL
BUN: 9 mg/dL (ref 6–23)
CO2: 23 mEq/L (ref 19–32)
Chloride: 99 mEq/L (ref 96–112)
Glucose, Bld: 157 mg/dL — ABNORMAL HIGH (ref 70–99)
Potassium: 4.4 mEq/L (ref 3.5–5.1)
Sodium: 131 mEq/L — ABNORMAL LOW (ref 135–145)

## 2012-04-16 MED ORDER — NALOXONE HCL 0.4 MG/ML IJ SOLN: 0.4000 mg | INTRAMUSCULAR | Status: DC | PRN

## 2012-04-16 MED ORDER — ONDANSETRON HCL 4 MG/2ML IJ SOLN: 4.0000 mg | Freq: Four times a day (QID) | INTRAMUSCULAR | Status: DC | PRN

## 2012-04-16 MED ORDER — DIPHENHYDRAMINE HCL 12.5 MG/5ML PO ELIX: 12.5000 mg | ORAL_SOLUTION | Freq: Four times a day (QID) | ORAL | Status: DC | PRN

## 2012-04-16 MED ORDER — HEPARIN SODIUM (PORCINE) 5000 UNIT/ML IJ SOLN
5000.0000 [IU] | Freq: Three times a day (TID) | INTRAMUSCULAR | Status: DC
  Administered 2012-04-16 – 2012-04-22 (×18): 5000 [IU] via SUBCUTANEOUS
  Filled 2012-04-16 (×26): qty 1

## 2012-04-16 MED ORDER — BIOTENE DRY MOUTH MT LIQD
15.0000 mL | Freq: Two times a day (BID) | OROMUCOSAL | Status: DC
  Administered 2012-04-16 – 2012-04-21 (×10): 15 mL via OROMUCOSAL

## 2012-04-16 MED ORDER — CHLORHEXIDINE GLUCONATE 0.12 % MT SOLN
15.0000 mL | Freq: Two times a day (BID) | OROMUCOSAL | Status: DC
  Administered 2012-04-16 – 2012-04-21 (×10): 15 mL via OROMUCOSAL
  Filled 2012-04-16 (×14): qty 15

## 2012-04-16 MED ORDER — PANTOPRAZOLE SODIUM 40 MG IV SOLR
40.0000 mg | Freq: Every day | INTRAVENOUS | Status: DC
  Administered 2012-04-16 – 2012-04-19 (×5): 40 mg via INTRAVENOUS
  Filled 2012-04-16 (×7): qty 40

## 2012-04-16 MED ORDER — ONDANSETRON HCL 4 MG PO TABS
4.0000 mg | ORAL_TABLET | Freq: Four times a day (QID) | ORAL | Status: DC | PRN
  Filled 2012-04-16: qty 1

## 2012-04-16 MED ORDER — DIPHENHYDRAMINE HCL 50 MG/ML IJ SOLN: 12.5000 mg | Freq: Four times a day (QID) | INTRAMUSCULAR | Status: DC | PRN

## 2012-04-16 MED ORDER — SODIUM CHLORIDE 0.9 % IJ SOLN
9.0000 mL | INTRAMUSCULAR | Status: DC | PRN
  Administered 2012-04-16: 9 mL via INTRAVENOUS

## 2012-04-16 MED ORDER — LACTATED RINGERS IV SOLN: INTRAVENOUS | Status: DC

## 2012-04-16 MED ORDER — SODIUM CHLORIDE 0.9 % IV SOLN
1.0000 g | INTRAVENOUS | Status: DC
  Administered 2012-04-16 – 2012-04-21 (×6): 1 g via INTRAVENOUS
  Filled 2012-04-16 (×7): qty 1

## 2012-04-16 NOTE — Brief Op Note (Signed)
Pt tx from PACU to room 1238 via bed, sr x 4 up w/ port cardiac monitor, port oxygen and PACU RN escort, report given to rec RN, opportunity for questions provided.

## 2012-04-16 NOTE — Progress Notes (Signed)
CARE MANAGEMENT NOTE 04/16/2012  Patient:  Christopher Mora, Christopher Mora   Account Number:  [id number]  Date Initiated:  04/16/2012  Documentation initiated by:  Aki Burdin  Subjective/Objective Assessment:   pt with abd pain, ruptured diverticulm and requiring temp colosty after resection transferred to sdu with vent and cardiac monitor     Action/Plan:   lives at home   Anticipated DC Date:  04/19/2012   Anticipated DC Plan:  HOME/SELF CARE  In-house referral  NA      DC Planning Services  NA      [facility] Choice  NA   Choice offered to / List presented to:  NA   DME arranged  NA      DME agency  NA     HH arranged  NA      HH agency  NA   Status of service:  In process, will continue to follow Medicare Important Message given?  NA - LOS <3 / Initial given by admissions (If response is "NO", the following Medicare IM given date fields will be blank) Date Medicare IM given:   Date Additional Medicare IM given:    Discharge Disposition:    Per UR Regulation:  Reviewed for med. necessity/level of care/duration of stay  If discussed at Long Length of Stay Meetings, dates discussed:    Comments:  [id number]/Ande Therrell Earlene Plater, RN, BSN, CCM No discharge needs present at time of this review at the sdu/icu level. Case Management [phone]

## 2012-04-16 NOTE — Anesthesia Postprocedure Evaluation (Signed)
Anesthesia Post Note  Patient: Christopher Mora  Procedure(s) Performed: Procedure(s) (LRB): EXPLORATORY LAPAROTOMY (N/A) COLOSTOMY () PARTIAL COLECTOMY () APPENDECTOMY ()  Anesthesia type: General  Patient location: PACU  Post pain: Pain level controlled  Post assessment: Post-op Vital signs reviewed  Last Vitals:  Filed Vitals:   04/16/12 0000  BP: 162/82  Pulse: 83  Temp: 36.3 C  Resp: 10    Post vital signs: Reviewed  Level of consciousness: sedated  Complications: No apparent anesthesia complications

## 2012-04-16 NOTE — Progress Notes (Signed)
1 Day Post-Op  Subjective: Alert and off vent, feels better than last PM  Objective: Vital signs in last 24 hours: Temp:  [97.3 F (36.3 C)-98.3 F (36.8 C)] 97.4 F (36.3 C) (05/21 0300) Pulse Rate:  [77-110] 80  (05/21 0800) Resp:  [7-18] 9  (05/21 0800) BP: (106-162)/(58-87) 113/58 mmHg (05/21 0800) SpO2:  [92 %-99 %] 97 % (05/21 0800) FiO2 (%):  [93 %] 93 % (05/20 1901) Weight:  [64 kg (141 lb 1.5 oz)-65.772 kg (145 lb)] 64 kg (141 lb 1.5 oz) (05/21 0400)    Intake/Output from previous day: 05/20 0701 - 05/21 0700 In: 3331.5 [I.V.:3301.5; NG/GT:30] Out: 1230 [Urine:780; Emesis/NG output:250; Blood:50] Intake/Output this shift: Total I/O In: 128 [I.V.:128] Out: -   General appearance: alert, cooperative and no distress Resp: clear to auscultation bilaterally GI: Dressing is dry, no bowel sounds or flatus, he's not distended.  Ng working well.  Some clear liquid in ostomy bag.  Lab Results:   Basename 04/16/12 0330 04/15/12 1445  WBC 12.7* 8.8  HGB 14.8 16.3  HCT 43.0 45.7  PLT 236 247    BMET  Basename 04/16/12 0330 04/15/12 1445  NA 131* 135  K 4.4 4.6  CL 99 97  CO2 23 27  GLUCOSE 157* 122*  BUN 9 12  CREATININE 0.59 0.77  CALCIUM 7.7* 8.7   PT/INR No results found for this basename: LABPROT:2,INR:2 in the last 72 hours   Lab 04/15/12 1445  AST 16  ALT 10  ALKPHOS 71  BILITOT 0.5  PROT 7.0  ALBUMIN 3.5     Lipase     Component Value Date/Time   LIPASE 12 04/15/2012 1445     Studies/Results: Ct Abdomen Pelvis W Contrast  04/15/2012  *RADIOLOGY REPORT*  Clinical Data: Lower abdominal pain.  CT ABDOMEN AND PELVIS WITH CONTRAST  Technique:  Multidetector CT imaging of the abdomen and pelvis was performed following the standard protocol during bolus administration of intravenous contrast.  Contrast: OMNIPAQUE IOHEXOL 300 MG/ML  SOLN  Comparison: None.  Findings: There is moderate amount of free intraperitoneal air as well as free fluid  throughout the abdomen and pelvis.  Exact level of the bowel perforation is not definitively localized.  The transverse colon is decompressed and somewhat thickened in appearance.  There also is some thickening evident at the level of the cecum and terminal ileum.  Diffuse diverticulosis of the sigmoid colon present.  No overt extravasation of oral contrast is identified.  The liver, pancreas, gallbladder, adrenal glands, spleen and kidneys are unremarkable. Atherosclerosis of the abdominal aorta and iliac arteries present without evidence of aneurysm.  There likely is significant stenosis of the left external and common femoral arteries secondary to plaque.  No hernias are identified. The bladder is unremarkable.  No significant bony abnormalities.  IMPRESSION:  1.  Free intraperitoneal air and free fluid consistent with bowel perforation.  The exact level perforation is not accurately identified by CT.  Suspect colonic perforation based on imaging findings. 2.  Incidental significant atherosclerotic narrowing of the left external iliac and common femoral arteries.  Critical Value/emergent results were called by telephone at the time of interpretation on 04/15/2012  at 1825 hours  to  Dr. Fredderick Phenix, who verbally acknowledged these results.  Original Report Authenticated By: Reola Calkins, M.D.    Medications:    . dextrose 5 % and 0.9 % NaCl with KCl 20 mEq/L      . ertapenem  1 g Intravenous  60 min Pre-Op  . ertapenem (INVANZ) IV  1 g Intravenous Q24H  . fentaNYL      . heparin  5,000 Units Subcutaneous Q8H  .  HYDROmorphone (DILAUDID) injection  1 mg Intravenous Once  . morphine   Intravenous Q4H  . morphine      .  morphine injection  6 mg Intravenous Once  . ondansetron  4 mg Intravenous Once  . ondansetron  8 mg Oral Once  . pantoprazole (PROTONIX) IV  40 mg Intravenous QHS  . sodium chloride  1,000 mL Intravenous Once    Assessment/Plan perforated sigmoid diverticulitis s/p:EXPLORATORY  LAPAROTOMYPARTIAL COLECTOMY  APPENDECTOMY,COLOSTOMY 04/15/2012 [name] Hx Asthma, Pnemonia WBC 12.7  On Invanz    Plan;  OOB to chair, IS/Flutter valve, continue antibiotics/NG.      LOS: 1 day    Christopher Mora 04/16/2012

## 2012-04-16 NOTE — Progress Notes (Signed)
Agree.. Looks stable.

## 2012-04-17 ENCOUNTER — Inpatient Hospital Stay (HOSPITAL_COMMUNITY): Payer: Self-pay

## 2012-04-17 LAB — BASIC METABOLIC PANEL
CO2: 25 mEq/L (ref 19–32)
Calcium: 7.8 mg/dL — ABNORMAL LOW (ref 8.4–10.5)
Chloride: 104 mEq/L (ref 96–112)
Potassium: 4.2 mEq/L (ref 3.5–5.1)
Sodium: 134 mEq/L — ABNORMAL LOW (ref 135–145)

## 2012-04-17 LAB — CBC
Hemoglobin: 13.3 g/dL (ref 13.0–17.0)
Platelets: 222 10*3/uL (ref 150–400)
RBC: 4.08 MIL/uL — ABNORMAL LOW (ref 4.22–5.81)
WBC: 12.9 10*3/uL — ABNORMAL HIGH (ref 4.0–10.5)

## 2012-04-17 LAB — MAGNESIUM: Magnesium: 1.8 mg/dL (ref 1.5–2.5)

## 2012-04-17 MED ORDER — KCL IN DEXTROSE-NACL 20-5-0.9 MEQ/L-%-% IV SOLN
INTRAVENOUS | Status: DC
  Administered 2012-04-17 – 2012-04-21 (×5): via INTRAVENOUS
  Filled 2012-04-17 (×10): qty 1000

## 2012-04-17 MED ORDER — FUROSEMIDE 10 MG/ML IJ SOLN
20.0000 mg | Freq: Once | INTRAMUSCULAR | Status: AC
  Administered 2012-04-17: 20 mg via INTRAVENOUS
  Filled 2012-04-17: qty 2

## 2012-04-17 NOTE — Progress Notes (Signed)
At 1620 Pt transferred to 1345 via wheelchair with his personal belongings. Receiving nurse given report. Pt stable.

## 2012-04-17 NOTE — Progress Notes (Signed)
Abdominal dressing changed at 1900. 2 small sutures noted at either side of wound. Sm amt clear drainage. One 4x4 placed in incision site using aseptic technique. Pt tolerated well. Will continue to monitor. Christopher Mora fundraiser

## 2012-04-17 NOTE — Progress Notes (Signed)
Pt with <20 mL residual at NG tube. Checked placement. NGT removed per MD order. Pt tolerated well. Will monitor for nausea. Merlyn Albert Charity fundraiser

## 2012-04-17 NOTE — Progress Notes (Signed)
2 Days Post-Op  Subjective: Feels OK  Objective: Vital signs in last 24 hours: Temp:  [97.4 F (36.3 C)-98.6 F (37 C)] 98.3 F (36.8 C) (05/22 0400) Pulse Rate:  [81-94] 81  (05/22 0800) Resp:  [8-16] 8  (05/22 0800) BP: (96-113)/(58-67) 105/65 mmHg (05/22 0800) SpO2:  [89 %-96 %] 96 % (05/22 0800)    Intake/Output from previous day: 05/21 0701 - 05/22 0700 In: 3058 [I.V.:2968; NG/GT:30; IV Piggyback:60] Out: 1175 [Urine:825; Emesis/NG output:100; Stool:250] Intake/Output this shift: Total I/O In: 125 [I.V.:125] Out: -   Incision/Wound:CLEAN AND OPEN OSTOMY PINK WITH SOME OUTPUT  Lab Results:   Basename 04/17/12 0350 04/16/12 0330  WBC 12.9* 12.7*  HGB 13.3 14.8  HCT 38.5* 43.0  PLT 222 236   BMET  Basename 04/17/12 0350 04/16/12 0330  NA 134* 131*  K 4.2 4.4  CL 104 99  CO2 25 23  GLUCOSE 129* 157*  BUN 7 9  CREATININE 0.58 0.59  CALCIUM 7.8* 7.7*   PT/INR No results found for this basename: LABPROT:2,INR:2 in the last 72 hours ABG No results found for this basename: PHART:2,PCO2:2,PO2:2,HCO3:2 in the last 72 hours  Studies/Results: Ct Abdomen Pelvis W Contrast  04/15/2012  *RADIOLOGY REPORT*  Clinical Data: Lower abdominal pain.  CT ABDOMEN AND PELVIS WITH CONTRAST  Technique:  Multidetector CT imaging of the abdomen and pelvis was performed following the standard protocol during bolus administration of intravenous contrast.  Contrast: OMNIPAQUE IOHEXOL 300 MG/ML  SOLN  Comparison: None.  Findings: There is moderate amount of free intraperitoneal air as well as free fluid throughout the abdomen and pelvis.  Exact level of the bowel perforation is not definitively localized.  The transverse colon is decompressed and somewhat thickened in appearance.  There also is some thickening evident at the level of the cecum and terminal ileum.  Diffuse diverticulosis of the sigmoid colon present.  No overt extravasation of oral contrast is identified.  The liver,  pancreas, gallbladder, adrenal glands, spleen and kidneys are unremarkable. Atherosclerosis of the abdominal aorta and iliac arteries present without evidence of aneurysm.  There likely is significant stenosis of the left external and common femoral arteries secondary to plaque.  No hernias are identified. The bladder is unremarkable.  No significant bony abnormalities.  IMPRESSION:  1.  Free intraperitoneal air and free fluid consistent with bowel perforation.  The exact level perforation is not accurately identified by CT.  Suspect colonic perforation based on imaging findings. 2.  Incidental significant atherosclerotic narrowing of the left external iliac and common femoral arteries.  Critical Value/emergent results were called by telephone at the time of interpretation on 04/15/2012  at 1825 hours  to  Dr. Fredderick Phenix, who verbally acknowledged these results.  Original Report Authenticated By: Reola Calkins, M.D.    Anti-infectives: Anti-infectives     Start     Dose/Rate Route Frequency Ordered Stop   04/16/12 2000   ertapenem (INVANZ) 1 g in sodium chloride 0.9 % 50 mL IVPB        1 g 100 mL/hr over 30 Minutes Intravenous Every 24 hours 04/16/12 0111     04/15/12 1954   ertapenem (INVANZ) 1 g in sodium chloride 0.9 % 50 mL IVPB        1 g 100 mL/hr over 30 Minutes Intravenous 60 min pre-op 04/15/12 1954 04/15/12 2018          Assessment/Plan: s/p Procedure(s) (LRB): EXPLORATORY LAPAROTOMY (N/A) COLOSTOMY () PARTIAL COLECTOMY () APPENDECTOMY ()  To floor. D/C foley Clamp NGT PULMONARY TOILET CXR for COPD and O 2 REQUIREMENT  Probably volume overloaded Ambulate Wound care  LOS: 2 days    Christopher Maresh A. 04/17/2012

## 2012-04-17 NOTE — Progress Notes (Signed)
2 Days Post-Op  Subjective: Feels OK  Objective: Vital signs in last 24 hours: Temp:  [97.4 F (36.3 C)-98.6 F (37 C)] 98.3 F (36.8 C) (05/22 0400) Pulse Rate:  [81-94] 81  (05/22 0800) Resp:  [8-16] 8  (05/22 0800) BP: (96-113)/(58-67) 105/65 mmHg (05/22 0800) SpO2:  [89 %-96 %] 96 % (05/22 0800)    Intake/Output from previous day: 05/21 0701 - 05/22 0700 In: 3058 [I.V.:2968; NG/GT:30; IV Piggyback:60] Out: 1175 [Urine:825; Emesis/NG output:100; Stool:250] Intake/Output this shift: Total I/O In: 125 [I.V.:125] Out: -   Incision/Wound:CLEAN AND OPEN OSTOMY PINK WITH SOME OUTPUT  Lab Results:   Basename 04/17/12 0350 04/16/12 0330  WBC 12.9* 12.7*  HGB 13.3 14.8  HCT 38.5* 43.0  PLT 222 236   BMET  Basename 04/17/12 0350 04/16/12 0330  NA 134* 131*  K 4.2 4.4  CL 104 99  CO2 25 23  GLUCOSE 129* 157*  BUN 7 9  CREATININE 0.58 0.59  CALCIUM 7.8* 7.7*   PT/INR No results found for this basename: LABPROT:2,INR:2 in the last 72 hours ABG No results found for this basename: PHART:2,PCO2:2,PO2:2,HCO3:2 in the last 72 hours  Studies/Results: Ct Abdomen Pelvis W Contrast  04/15/2012  *RADIOLOGY REPORT*  Clinical Data: Lower abdominal pain.  CT ABDOMEN AND PELVIS WITH CONTRAST  Technique:  Multidetector CT imaging of the abdomen and pelvis was performed following the standard protocol during bolus administration of intravenous contrast.  Contrast: 100mL OMNIPAQUE IOHEXOL 300 MG/ML  SOLN  Comparison: None.  Findings: There is moderate amount of free intraperitoneal air as well as free fluid throughout the abdomen and pelvis.  Exact level of the bowel perforation is not definitively localized.  The transverse colon is decompressed and somewhat thickened in appearance.  There also is some thickening evident at the level of the cecum and terminal ileum.  Diffuse diverticulosis of the sigmoid colon present.  No overt extravasation of oral contrast is identified.  The liver,  pancreas, gallbladder, adrenal glands, spleen and kidneys are unremarkable. Atherosclerosis of the abdominal aorta and iliac arteries present without evidence of aneurysm.  There likely is significant stenosis of the left external and common femoral arteries secondary to plaque.  No hernias are identified. The bladder is unremarkable.  No significant bony abnormalities.  IMPRESSION:  1.  Free intraperitoneal air and free fluid consistent with bowel perforation.  The exact level perforation is not accurately identified by CT.  Suspect colonic perforation based on imaging findings. 2.  Incidental significant atherosclerotic narrowing of the left external iliac and common femoral arteries.  Critical Value/emergent results were called by telephone at the time of interpretation on 04/15/2012  at 1825 hours  to  Dr. Belfi, who verbally acknowledged these results.  Original Report Authenticated By: GLENN T. YAMAGATA, M.D.    Anti-infectives: Anti-infectives     Start     Dose/Rate Route Frequency Ordered Stop   04/16/12 2000   ertapenem (INVANZ) 1 g in sodium chloride 0.9 % 50 mL IVPB        1 g 100 mL/hr over 30 Minutes Intravenous Every 24 hours 04/16/12 0111     04/15/12 1954   ertapenem (INVANZ) 1 g in sodium chloride 0.9 % 50 mL IVPB        1 g 100 mL/hr over 30 Minutes Intravenous 60 min pre-op 04/15/12 1954 04/15/12 2018          Assessment/Plan: s/p Procedure(s) (LRB): EXPLORATORY LAPAROTOMY (N/A) COLOSTOMY () PARTIAL COLECTOMY () APPENDECTOMY ()   To floor. D/C foley Clamp NGT PULMONARY TOILET CXR for COPD and O 2 REQUIREMENT  Probably volume overloaded Ambulate Wound care  LOS: 2 days    Julliette Frentz A. 04/17/2012   

## 2012-04-18 NOTE — Treatment Plan (Signed)
Microbiology report called as critical value. Mr. Straus fluid sampled on 5/20 grew out moderate group c strep. He is on Invanz daily. Result called to Will Marlyne Beards PA at 1:45 pm. Rondall Allegra RN

## 2012-04-18 NOTE — Progress Notes (Signed)
crCRITICAL VALUE ALERT  Critical value received:  Moderate strep c in body fluid specimen 5/20  Date of notification:  5/23  Time of notification:  1330  Critical value read back:yes  Nurse who received alert:  Oralia Rud  MD notified (1st page):  Thos. Cornett (asked that PA be called)  Time of first page:  1335  MD notified (2nd page): Will Marlyne Beards  Time of second page: 1345  Responding MD:  Zola Button, PA  Time MD responded:  [phone]

## 2012-04-18 NOTE — Progress Notes (Signed)
3 Days Post-Op  Subjective: Sitting up watching TV, he's not SOB, coughing up allot.    Objective: Vital signs in last 24 hours: Temp:  [97.8 F (36.6 C)-98.5 F (36.9 C)] 98.2 F (36.8 C) (05/23 0951) Pulse Rate:  [77-93] 81  (05/23 0951) Resp:  [7-17] 17  (05/23 0951) BP: (109-163)/(63-87) 145/87 mmHg (05/23 0951) SpO2:  [83 %-97 %] 93 % (05/23 0951) FiO2 (%):  [92 %-96 %] 92 % (05/23 0806) Weight:  [68.1 kg (150 lb 2.1 oz)] 68.1 kg (150 lb 2.1 oz) (05/22 1647) Last BM Date: 04/17/12  Intake/Output from previous day: 05/22 0701 - 05/23 0700 In: 7539 [I.V.:7539] Out: 1880 [Urine:1865; Emesis/NG output:15] Intake/Output this shift:    General appearance: alert, cooperative and no distress Resp: clear to auscultation bilaterally GI: +BS, still tender, open wound looks good, some yellow brown colored fluid in bag, not much gas yet.  Lab Results:   Basename 04/17/12 0350 04/16/12 0330  WBC 12.9* 12.7*  HGB 13.3 14.8  HCT 38.5* 43.0  PLT 222 236    BMET  Basename 04/17/12 0350 04/16/12 0330  NA 134* 131*  K 4.2 4.4  CL 104 99  CO2 25 23  GLUCOSE 129* 157*  BUN 7 9  CREATININE 0.58 0.59  CALCIUM 7.8* 7.7*   PT/INR No results found for this basename: LABPROT:2,INR:2 in the last 72 hours   Lab 04/15/12 1445  AST 16  ALT 10  ALKPHOS 71  BILITOT 0.5  PROT 7.0  ALBUMIN 3.5     Lipase     Component Value Date/Time   LIPASE 12 04/15/2012 1445     Studies/Results: Dg Chest Port 1 View  04/17/2012  *RADIOLOGY REPORT*  Clinical Data: Short of breath  PORTABLE CHEST - 1 VIEW  Comparison: 11/24/2011  Findings: Bibasilar airspace opacities have developed.  Normal heart size.  No pneumothorax.  Pulmonary vascularity is within normal limits.  IMPRESSION: Increased bibasilar airspace opacities.  Original Report Authenticated By: Donavan Burnet, M.D.    Medications:    . antiseptic oral rinse  15 mL Mouth Rinse q12n4p  . chlorhexidine  15 mL Mouth Rinse BID  .  ertapenem (INVANZ) IV  1 g Intravenous Q24H  . furosemide  20 mg Intravenous Once  . heparin  5,000 Units Subcutaneous Q8H  . morphine   Intravenous Q4H  . pantoprazole (PROTONIX) IV  40 mg Intravenous QHS    Assessment/Plan perforated sigmoid diverticulitis s/p:EXPLORATORY LAPAROTOMYPARTIAL COLECTOMY  APPENDECTOMY,COLOSTOMY 04/15/2012 [name]  Hx Asthma, Pnemonia  WBC 12.7 On Invanz Heparin DVT  Plan: Mobilize, IS/Flutter at bedside.  On Invanz.     LOS: 3 days    Daymeon Fischman 04/18/2012

## 2012-04-18 NOTE — Progress Notes (Signed)
AGREE.  Need pulmonary toilet

## 2012-04-19 LAB — BODY FLUID CULTURE

## 2012-04-19 MED ORDER — MORPHINE SULFATE 2 MG/ML IJ SOLN
1.0000 mg | INTRAMUSCULAR | Status: DC | PRN
  Administered 2012-04-19 – 2012-04-20 (×3): 2 mg via INTRAVENOUS
  Filled 2012-04-19 (×3): qty 1

## 2012-04-19 MED ORDER — ALBUTEROL SULFATE (5 MG/ML) 0.5% IN NEBU
2.5000 mg | INHALATION_SOLUTION | Freq: Once | RESPIRATORY_TRACT | Status: AC
  Administered 2012-04-19: 2.5 mg via RESPIRATORY_TRACT
  Filled 2012-04-19: qty 0.5

## 2012-04-19 NOTE — Progress Notes (Signed)
UR complete 

## 2012-04-19 NOTE — Progress Notes (Signed)
The patient's O2 saturation dropped to 85% on [place] @ 5L.  Non rebreather placed on patient and O2 sats improved to low 90's.  The patient is requesting his PCA pump be replaced by PRN morphine d/t the continuous beeping of the monitor.  Dr. Ezzard Standing contacted to discuss possibly switching PCA as well as addition of breathing treatments and mucinex.  Per Dr. Ezzard Standing, Dr. Luisa Hart should be rounding soon and these decisions should be left to him.  Please make sure these issues are addressed today.  Thank you.

## 2012-04-19 NOTE — Progress Notes (Signed)
Agree.  Try clears

## 2012-04-19 NOTE — Progress Notes (Signed)
4 Days Post-Op  Subjective: Wants to get rid of PCA, No flatus, otherwise no complaints.  Objective: Vital signs in last 24 hours: Temp:  [97.8 F (36.6 C)-98 F (36.7 C)] 98 F (36.7 C) (05/24 0553) Pulse Rate:  [78-90] 90  (05/24 0648) Resp:  [11-17] 12  (05/24 0759) BP: (141-155)/(78-94) 155/94 mmHg (05/24 0553) SpO2:  [89 %-93 %] 91 % (05/24 0759) FiO2 (%):  [86 %-96 %] 86 % (05/24 0400) Last BM Date: 04/18/12  BP is up some, he had some trouble with low sats last PM too. Wants to get rid of PCA, no labs today  Intake/Output from previous day: 05/23 0701 - 05/24 0700 In: 625 [P.O.:625] Out: 1125 [Urine:1125] Intake/Output this shift: Total I/O In: 240 [P.O.:240] Out: 400 [Urine:400]  General appearance: alert, cooperative, no distress and tired Resp: clear to auscultation bilaterally and some rales, decreased BS at bases. GI: soft, tender, open wound looks good, BS hypoactice, clear fluid in ostomy.  Lab Results:   [facility] 04/17/12 0350  WBC 12.9*  HGB 13.3  HCT 38.5*  PLT 222    BMET  Basename 04/17/12 0350  NA 134*  K 4.2  CL 104  CO2 25  GLUCOSE 129*  BUN 7  CREATININE 0.58  CALCIUM 7.8*   PT/INR No results found for this basename: LABPROT:2,INR:2 in the last 72 hours   Lab 04/15/12 1445  AST 16  ALT 10  ALKPHOS 71  BILITOT 0.5  PROT 7.0  ALBUMIN 3.5     Lipase     Component Value Date/Time   LIPASE 12 04/15/2012 1445     Studies/Results: Dg Chest Port 1 View  04/17/2012  *RADIOLOGY REPORT*  Clinical Data: Short of breath  PORTABLE CHEST - 1 VIEW  Comparison: 11/24/2011  Findings: Bibasilar airspace opacities have developed.  Normal heart size.  No pneumothorax.  Pulmonary vascularity is within normal limits.  IMPRESSION: Increased bibasilar airspace opacities.  Original Report Authenticated By: Donavan Burnet, M.D.    Medications:    . albuterol  2.5 mg Nebulization Once  . antiseptic oral rinse  15 mL Mouth Rinse q12n4p  .  chlorhexidine  15 mL Mouth Rinse BID  . ertapenem (INVANZ) IV  1 g Intravenous Q24H  . heparin  5,000 Units Subcutaneous Q8H  . morphine   Intravenous Q4H  . pantoprazole (PROTONIX) IV  40 mg Intravenous QHS    Assessment/Plan perforated sigmoid diverticulitis s/p:EXPLORATORY LAPAROTOMYPARTIAL COLECTOMY  APPENDECTOMY,COLOSTOMY 04/15/2012 [name]  Hx Asthma, Pnemonia  WBC 12.7 On Invanz  Heparin DVT   Plan: D/c pca, mobilize, continue fluids at a lower rate, pulmonary toilet.     LOS: 4 days    Christopher Mora 04/19/2012

## 2012-04-20 LAB — ANAEROBIC CULTURE

## 2012-04-20 MED ORDER — PANTOPRAZOLE SODIUM 40 MG PO TBEC
40.0000 mg | DELAYED_RELEASE_TABLET | Freq: Every day | ORAL | Status: DC
  Administered 2012-04-20 – 2012-04-22 (×3): 40 mg via ORAL
  Filled 2012-04-20 (×3): qty 1

## 2012-04-20 MED ORDER — ENSURE COMPLETE PO LIQD
237.0000 mL | Freq: Two times a day (BID) | ORAL | Status: DC
  Administered 2012-04-20 – 2012-04-22 (×4): 237 mL via ORAL

## 2012-04-20 MED ORDER — OXYCODONE-ACETAMINOPHEN 5-325 MG PO TABS
1.0000 | ORAL_TABLET | ORAL | Status: DC | PRN
  Administered 2012-04-20 – 2012-04-22 (×4): 2 via ORAL
  Filled 2012-04-20 (×4): qty 2

## 2012-04-20 NOTE — Progress Notes (Signed)
The patient is receiving Protonix by the intravenous route.  Based on criteria approved by the Pharmacy and Therapeutics Committee and the Medical Executive Committee, the medication is being converted to the equivalent oral dose form.  These criteria include: -No Active GI bleeding -Able to tolerate diet of full liquids (or better) or tube feeding OR able to tolerate other medications by the oral or enteral route  If you have any questions about this conversion, please contact the Pharmacy Department (ext [phone]).  Thank you.  Berkley Harvey, [facility] 04/20/2012 12:25 PM

## 2012-04-20 NOTE — Progress Notes (Signed)
5 Days Post-Op  Subjective: Flatus in bag, tol fulls without n/v does not want more, no complaints  Objective: Vital signs in last 24 hours: Temp:  [98.3 F (36.8 C)-98.7 F (37.1 C)] 98.7 F (37.1 C) (05/25 0431) Pulse Rate:  [83-89] 83  (05/25 0431) Resp:  [16-18] 18  (05/25 0431) BP: (146-167)/(80-90) 146/80 mmHg (05/25 0431) SpO2:  [85 %-94 %] 90 % (05/25 0431) Last BM Date: 04/19/12  Intake/Output from previous day: 05/24 0701 - 05/25 0700 In: 1040 [P.O.:240; I.V.:800] Out: 1350 [Urine:1350] Intake/Output this shift:    General appearance: no distress Resp: clear to auscultation bilaterally Cardio: regular rate and rhythm GI: soft, wound clean without infection, approp tender, ostomy pink and air in bag    Anti-infectives: Anti-infectives     Start     Dose/Rate Route Frequency Ordered Stop   04/16/12 2000   ertapenem (INVANZ) 1 g in sodium chloride 0.9 % 50 mL IVPB        1 g 100 mL/hr over 30 Minutes Intravenous Every 24 hours 04/16/12 0111     04/15/12 1954   ertapenem (INVANZ) 1 g in sodium chloride 0.9 % 50 mL IVPB        1 g 100 mL/hr over 30 Minutes Intravenous 60 min pre-op 04/15/12 1954 04/15/12 2018          Assessment/Plan: S/p hartmanns procedure 1. Po pain meds 2. pulm toilet, oob and ambulate more 3. Wants to stay on fulls today but tolerating well, will add ensure 4. Cont iv abx, wbc stable, recheck in am 5. Sq heparin, scds    LOS: 5 days    [facility] 04/20/2012

## 2012-04-21 LAB — CBC
MCH: 32.1 pg (ref 26.0–34.0)
MCHC: 34.4 g/dL (ref 30.0–36.0)
Platelets: 311 10*3/uL (ref 150–400)

## 2012-04-21 NOTE — Progress Notes (Signed)
Cm spoke with pt concerning CM consult for [facility] for dressing changes upon dc. Per pt choice [facility] to provide [facility] services. [facility] rep Talmadge Coventry notified of [facility] services. Pt provided with information concerning DSS to apply for medicaid. Pt provided with information for [facility] for PCP follow up. No other needs stated. Family to assist in home care.    Leonie Green [phone]

## 2012-04-21 NOTE — Progress Notes (Signed)
6 Days Post-Op  Subjective: No complaints, tol diet but no real appetite, ambulating pain controlled  Objective: Vital signs in last 24 hours: Temp:  [97.9 F (36.6 C)-98.7 F (37.1 C)] 97.9 F (36.6 C) (05/26 0427) Pulse Rate:  [56-70] 67  (05/26 0427) Resp:  [20] 20  (05/26 0427) BP: (137-164)/(83-88) 145/83 mmHg (05/26 0427) SpO2:  [93 %] 93 % (05/26 0427) Last BM Date: 04/20/12 (blood tinged thick drainge)  Intake/Output from previous day: 05/25 0701 - 05/26 0700 In: 3237.7 [P.O.:360; I.V.:2777.8; IV Piggyback:99.9] Out: 2025 [Urine:1975; Stool:50] Intake/Output this shift:    General appearance: no distress Resp: clear to auscultation bilaterally Cardio: regular rate and rhythm GI: soft wound open ostomy functional  Lab Results:   Basename 04/21/12 0400  WBC 9.3  HGB 12.5*  HCT 36.3*  PLT 311   BMET No results found for this basename: NA:2,K:2,CL:2,CO2:2,GLUCOSE:2,BUN:2,CREATININE:2,CALCIUM:2 in the last 72 hours PT/INR No results found for this basename: LABPROT:2,INR:2 in the last 72 hours ABG No results found for this basename: PHART:2,PCO2:2,PO2:2,HCO3:2 in the last 72 hours  Studies/Results: No results found.  Anti-infectives: Anti-infectives     Start     Dose/Rate Route Frequency Ordered Stop   04/16/12 2000   ertapenem (INVANZ) 1 g in sodium chloride 0.9 % 50 mL IVPB        1 g 100 mL/hr over 30 Minutes Intravenous Every 24 hours 04/16/12 0111     04/15/12 1954   ertapenem (INVANZ) 1 g in sodium chloride 0.9 % 50 mL IVPB        1 g 100 mL/hr over 30 Minutes Intravenous 60 min pre-op 04/15/12 1954 04/15/12 2018          Assessment/Plan: S/p hartmanns 1. Po pain meds 2. pulm toilet, dc o2 3. Advance diet 4. Cont abx for 7 days, can switch to po tomorrow if discharged 5. Heparin scds 6. Plan home am 7. Setup home health for dressing changes   LOS: 6 days    [facility] 04/21/2012

## 2012-04-22 MED ORDER — OXYCODONE-ACETAMINOPHEN 5-325 MG PO TABS: 1.0000 | ORAL_TABLET | ORAL | Status: AC | PRN

## 2012-04-22 NOTE — Progress Notes (Signed)
7 Days Post-Op  Subjective: tol diet, wants to go home  Objective: Vital signs in last 24 hours: Temp:  [98.3 F (36.8 C)-98.6 F (37 C)] 98.6 F (37 C) (05/27 0500) Pulse Rate:  [66-89] 86  (05/27 0500) Resp:  [16-20] 20  (05/27 0500) BP: (135-143)/(77-87) 143/77 mmHg (05/27 0500) SpO2:  [90 %-94 %] 90 % (05/27 0500) Last BM Date: 04/21/12 (blood tinged liquid)  Intake/Output from previous day: 05/26 0701 - 05/27 0700 In: 650 [P.O.:600; IV Piggyback:50] Out: 2450 [Urine:2450] Intake/Output this shift: Total I/O In: 360 [P.O.:360] Out: 15 [Stool:15]  General appearance: no distress GI: soft, ostomy pink and functional, bs present wound clean  Lab Results:   Basename 04/21/12 0400  WBC 9.3  HGB 12.5*  HCT 36.3*  PLT 311   BMET No results found for this basename: NA:2,K:2,CL:2,CO2:2,GLUCOSE:2,BUN:2,CREATININE:2,CALCIUM:2 in the last 72 hours PT/INR No results found for this basename: LABPROT:2,INR:2 in the last 72 hours ABG No results found for this basename: PHART:2,PCO2:2,PO2:2,HCO3:2 in the last 72 hours  Studies/Results: No results found.  Anti-infectives: Anti-infectives     Start     Dose/Rate Route Frequency Ordered Stop   04/16/12 2000   ertapenem (INVANZ) 1 g in sodium chloride 0.9 % 50 mL IVPB        1 g 100 mL/hr over 30 Minutes Intravenous Every 24 hours 04/16/12 0111     04/15/12 1954   ertapenem (INVANZ) 1 g in sodium chloride 0.9 % 50 mL IVPB        1 g 100 mL/hr over 30 Minutes Intravenous 60 min pre-op 04/15/12 1954 04/15/12 2018          Assessment/Plan:  S/p hartmanns procedure  1. Po pain meds  2. pulm toilet, oob and ambulate more  3. Dc home today on reg diet 4. Can stop abx at 7d postop with nl wbc and afebrile 5 Home health for dressing changes   LOS: 7 days    [facility] 04/22/2012

## 2012-04-22 NOTE — Plan of Care (Signed)
Problem: Phase III Progression Outcomes Goal: Other Phase III Outcomes/Goals

## 2012-04-22 NOTE — Discharge Instructions (Signed)
Colostomy Surgery Care After Refer to this sheet in the next few weeks. These instructions provide you with information on caring for yourself after your procedure. Your caregiver may also give you more specific instructions. Your treatment has been planned according to current medical practices, but problems sometimes occur. Call your caregiver if you have any problems or questions after your procedure. HOME CARE INSTRUCTIONS  Rest. Give yourself time to adjust to having the external pouch. Give your surgical cuts (incisions) time to heal.   Avoid strenuous activity, heavy lifting, and abdominal exercises for 3 weeks. After that, you may return to your normal activities.   Take pain medicine and other medicines as directed. Your caregiver may recommend certain medicines for the relief of constipation or diarrhea.   You may shower with or without the pouch.   Wear any clothing that is comfortable.   Follow your caregiver's instructions to protect the skin around the stoma.   Change your pouch every 2 to 4 days, or as directed.   Follow your caregiver's dietary instructions. Your caregiver will help you understand which foods may cause blockage, increase bowel movements, slow bowel movements, cause skin irritation, or cause gas.   You may gradually resume most activities, including sexual activity, as directed. Ask your caregiver about becoming pregnant and about using birth control. Medicines may not be absorbed normally after your procedure.   Always discuss your medicines with your caregiver before taking them.   You may travel. Be sure to pack plenty of colostomy supplies.   If you smoke, quit. Smoking slows the healing process.  SEEK MEDICAL CARE IF:  You are having trouble caring for your stoma or using the ostomy supplies (changing the pouch).   You feel sick to your stomach (nauseous).   You throw up (vomit).   You notice bleeding, skin irritation, drainage, bulging,  redness of the wounds, or pain around the anus or stoma.   You notice a change in the size or appearance of the stoma.   You have abdominal pain, bloating, pressure, or cramping.   Your stools do not become firmer.   Your stool frequency is more or less often than expected.   You have frequent diarrhea or watery stool.   You are not making enough urine. This may be a sign of dehydration.   You experience sexual dysfunction.   You experience shortness of breath, fatigue, thirst, dry mouth, or unusual sensations in the limbs.   You have other new symptoms.   You have questions or concerns.  SEEK IMMEDIATE MEDICAL CARE IF:  Your abdominal pain does not go away or it becomes severe.   You have a fever.   You keep vomiting.   Your stool is not draining through the stoma.   You have an irregular heartbeat or chest pain.  MAKE SURE YOU:  Understand these instructions.   Will watch your condition.   Will get help right away if you are not doing well or get worse.  Document Released: 04/05/2011 Document Revised: 11/02/2011 Document Reviewed: 04/05/2011 [facility] Patient Information 2012 [place], [place].

## 2012-04-22 NOTE — Progress Notes (Signed)
Patient discharged home with discharge instructions and prescriptions given.  Patient and caregivers watched video on ostomy home care.  Instructed patient and caregivers on dressing changes and ostomy care.  Verbalization of understanding.  Home health will be seeing the patient tomorrow.

## 2012-04-29 NOTE — Discharge Summary (Signed)
Physician Discharge Summary  Patient ID: Airen Stiehl MRN: [id number] DOB/AGE: 1951-08-05 61 y.o.  Admit date: 04/15/2012 Discharge date: 04/22/12  Admission Diagnoses: Perforated sigmoid diverticulitis. Hx Asthma, Pnemonia   Discharge Diagnoses: Same Active Problems:  * No active hospital problems. *    PROCEDURES: EXPLORATORY LAPAROTOMYPARTIAL COLECTOMY  APPENDECTOMY,COLOSTOMY 04/15/2012 Dr. Vikki Ports Course: 61 year old male who states that about 5 days ago after a meal he developed some significant aching abdominal discomfort across his lower abdomen. This bothered him for about 2 days and then went away. It was not severe enough to seek attention and there were no associated symptoms. He was feeling well yesterday but awoke this morning about 10:00 with severe abdominal pain across his entire lower and mid abdomen. This was associated with nausea and he has had several episodes of vomiting. He had a soft bowel movement this morning that appeared somewhat black. He has had no melena or hematochezia or hematemesis. He denies fever or chills. He denies chronic dyspeptic symptoms or chronic abdominal or GI complaints of any kind. No fever or chills. No urinary symptoms.He was found to have a perforated viscus on admission and take to the OR. Post op he did fairly well he required allot of pulmonary toilet.  His diet was advanced and he made good progress. By 04/22/12 he was tolerating a diet well and was discharged home by Dr. Dwain Sarna.   Disposition: 06-Home-Health Care Svc  Discharge Orders    Future Appointments: Provider: Department: Dept Phone: Center:   05/02/2012 9:20 AM Mariella Saa, MD [facility] [phone] None     Medication List  As of 04/29/2012  7:10 PM   TAKE these medications         bismuth subsalicylate 262 MG/15ML suspension   Commonly known as: PEPTO BISMOL   Take 15 mLs by mouth every 6 (six) hours as needed. Stomach upset      Melatonin  5 MG Tabs   Take 1 tablet by mouth at bedtime.      oxyCODONE-acetaminophen 5-325 MG per tablet   Commonly known as: PERCOCET   Take 1-2 tablets by mouth every 4 (four) hours as needed.           Follow-up Information    Follow up with HOXWORTH,BENJAMIN T, MD. Schedule an appointment as soon as possible for a visit in 2 weeks.   Contact information:   [employer] [facility] [place] [address] [place] [address] [phone]          Signed: Sherrie George 04/29/2012, 7:10 PM

## 2012-05-02 ENCOUNTER — Ambulatory Visit (INDEPENDENT_AMBULATORY_CARE_PROVIDER_SITE_OTHER): Payer: Self-pay | Admitting: General Surgery

## 2012-05-02 ENCOUNTER — Encounter (INDEPENDENT_AMBULATORY_CARE_PROVIDER_SITE_OTHER): Payer: Self-pay | Admitting: General Surgery

## 2012-05-02 VITALS — BP 114/68 | HR 70 | Temp 97.8°F | Resp 14 | Ht 68.0 in | Wt 129.5 lb

## 2012-05-02 DIAGNOSIS — K5732 Diverticulitis of large intestine without perforation or abscess without bleeding: Secondary | ICD-10-CM

## 2012-05-02 DIAGNOSIS — K5780 Diverticulitis of intestine, part unspecified, with perforation and abscess without bleeding: Secondary | ICD-10-CM

## 2012-05-02 NOTE — Progress Notes (Signed)
Patient returns for his first postoperative visit 3 weeks following emergency Hartmann colectomy for perforated diverticulitis. He and his wife states he is getting along very well. He denies abdominal pain or fever and is eating well and getting his strength back. He does have some drainage from the open wound dressing and his wife is changing this daily.  Exam: BP 114/68  Pulse 70  Temp(Src) 97.8 F (36.6 C) (Temporal)  Resp 14  Ht 5\' 8"  (1.727 m)  Wt 129 lb 8 oz (58.741 kg)  BMI 19.69 kg/m2  General he looks well Abdomen: Soft and nontender. Colostomy healthy. His midline wound has a lot of superficial necrotic debris which I sharply debrided. His fascial sutures are loose but there is no evidence of dehiscence.  Assessment and plan: Doing well although has a fair amount of debris in his wound. It looked better after debridement today. His wife full increase dressing changes to twice daily. Return in 4 weeks

## 2012-06-07 ENCOUNTER — Ambulatory Visit (INDEPENDENT_AMBULATORY_CARE_PROVIDER_SITE_OTHER): Payer: Self-pay | Admitting: General Surgery

## 2012-06-07 ENCOUNTER — Encounter (INDEPENDENT_AMBULATORY_CARE_PROVIDER_SITE_OTHER): Payer: Self-pay | Admitting: General Surgery

## 2012-06-07 VITALS — BP 147/83 | HR 93 | Temp 98.2°F | Ht 68.0 in | Wt 129.2 lb

## 2012-06-07 DIAGNOSIS — Z09 Encounter for follow-up examination after completed treatment for conditions other than malignant neoplasm: Secondary | ICD-10-CM

## 2012-06-07 NOTE — Progress Notes (Signed)
History: Patient returns now 7 weeks post emergency Hartmann colectomy for perforated diverticulitis. He is doing much better. After his wound debridement at his last visit they have not noted any overt drainage from the wound. His appetite is good he is eating well. No difficulty with the colostomy. No fever or abdominal pain.  Exam: BP 147/83  Pulse 93  Temp 98.2 F (36.8 C) (Temporal)  Ht 5\' 8"  (1.727 m)  Wt 129 lb 3.2 oz (58.605 kg)  BMI 19.64 kg/m2  SpO2 96%  Gen.: Appears well Abdomen: Generally soft and nontender. Stoma healthy. His midline wound is now clean granulation tissue. No evidence of hernia at this point. I debrided some further exposed PDS suture.  Assessment and plan: Doing well following emergency Hartmann colectomy without complication identified. Return in 6 weeks.

## 2012-07-18 ENCOUNTER — Ambulatory Visit (INDEPENDENT_AMBULATORY_CARE_PROVIDER_SITE_OTHER): Payer: Self-pay | Admitting: General Surgery

## 2012-07-18 ENCOUNTER — Encounter (INDEPENDENT_AMBULATORY_CARE_PROVIDER_SITE_OTHER): Payer: Self-pay | Admitting: General Surgery

## 2012-07-18 VITALS — BP 128/76 | HR 91 | Temp 98.1°F | Ht 68.0 in | Wt 132.4 lb

## 2012-07-18 DIAGNOSIS — Z09 Encounter for follow-up examination after completed treatment for conditions other than malignant neoplasm: Secondary | ICD-10-CM

## 2012-07-18 NOTE — Progress Notes (Signed)
History: Patient returns following emergency Hartmann colectomy for perforated diverticulitis in May. He reports he is feeling well. He has had minimal if any drainage on his wound bandage. No dome or GI complaints and is beginning to gain weight and strength.  Exam: Gen.: Appears well Abdomen: Soft and nontender. There was one suture exposed in his midline incision that I trimmed away but it is essentially completely healed. Colostomy appears healthy.  Assessment and plan: Doing well following Hartmann colectomy. We discussed eventual plans for takedown which would be early next year. I will see him back around November

## 2012-10-18 ENCOUNTER — Encounter (INDEPENDENT_AMBULATORY_CARE_PROVIDER_SITE_OTHER): Payer: Self-pay | Admitting: General Surgery

## 2012-10-18 ENCOUNTER — Ambulatory Visit (INDEPENDENT_AMBULATORY_CARE_PROVIDER_SITE_OTHER): Payer: Self-pay | Admitting: General Surgery

## 2012-10-18 VITALS — BP 128/84 | HR 71 | Temp 97.0°F | Resp 16 | Wt 133.2 lb

## 2012-10-18 DIAGNOSIS — Z933 Colostomy status: Secondary | ICD-10-CM

## 2012-10-18 NOTE — Progress Notes (Signed)
Chief complaint: Followup Hartmann colectomy  History: Patient returns for followup of her emergency Hartmann colectomy for perforated diverticulitis in May of this year. He continues to do well without any abdominal complaints.  Past Medical History  Diagnosis Date  . Asthma   . Pneumonia    Past Surgical History  Procedure Date  . Knee surgery 1973    Left  . Shoulder surgery 2009  . Cataract extraction   . Intraocular lens implant, secondary   . Knee surgery   . Shoulder surgery   . Laparotomy 04/15/2012    Procedure: EXPLORATORY LAPAROTOMY;  Surgeon: Mariella Saa, MD;  Location: [facility];  Service: General;  Laterality: N/A;  . Colostomy 04/15/2012    Procedure: COLOSTOMY;  Surgeon: Mariella Saa, MD;  Location: [facility];  Service: General;;  . Partial colectomy 04/15/2012    Procedure: PARTIAL COLECTOMY;  Surgeon: Mariella Saa, MD;  Location: [facility];  Service: General;;  sigmoid colectomy  . Appendectomy 04/15/2012    Procedure: APPENDECTOMY;  Surgeon: Mariella Saa, MD;  Location: [facility];  Service: General;;   Current Outpatient Prescriptions  Medication Sig Dispense Refill  . Melatonin 5 MG TABS Take 1 tablet by mouth at bedtime.       No Known Allergies  Exam: BP 128/84  Pulse 71  Temp 97 F (36.1 C) (Temporal)  Resp 16  Wt 133 lb 3.2 oz (60.419 kg) General: Thin but well appearing Caucasian male Skin: Warm and dry no rash infection Lungs: Clear equal breath sounds Cardiac: Regular rate and rhythm. No edema Abdomen: Healed midline incision. There is a slightly wide wound from secondary healing but no definite hernia. Stoma healthy. No tenderness or masses or organomegaly.  Assessment and plan: Status post Hartmann colectomy for perforated diverticulitis. He of course like his colostomy reversed we will plan to do this in January. I will get a preoperative contrast study of his colon and rectum. We discussed the surgery in detail including its  nature and risks of anesthetic complications, bleeding, infection, anastomotic leak. He is given a mechanical and antibiotic bowel prep.

## 2012-11-06 ENCOUNTER — Ambulatory Visit
Admission: RE | Admit: 2012-11-06 | Discharge: 2012-11-06 | Disposition: A | Discharge status: Home / Self Care | Payer: No Typology Code available for payment source | Source: Ambulatory Visit | Attending: General Surgery | Admitting: General Surgery

## 2012-11-06 DIAGNOSIS — Z933 Colostomy status: Secondary | ICD-10-CM

## 2012-12-03 ENCOUNTER — Telehealth (INDEPENDENT_AMBULATORY_CARE_PROVIDER_SITE_OTHER): Payer: Self-pay | Admitting: General Surgery

## 2012-12-03 NOTE — Telephone Encounter (Signed)
Patient calling because he is ready to set up his colostomy reversal. He states he has never spoken to scheduling about this. If patient needs orders for scheduling please write and send to them.

## 2012-12-04 ENCOUNTER — Other Ambulatory Visit (INDEPENDENT_AMBULATORY_CARE_PROVIDER_SITE_OTHER): Payer: Self-pay | Admitting: General Surgery

## 2012-12-06 NOTE — Telephone Encounter (Signed)
Orders placed in [facility] 12/03/12

## 2012-12-12 ENCOUNTER — Encounter (HOSPITAL_COMMUNITY): Payer: Self-pay | Admitting: Pharmacy Technician

## 2012-12-16 ENCOUNTER — Other Ambulatory Visit (HOSPITAL_COMMUNITY): Payer: Self-pay | Admitting: *Deleted

## 2012-12-17 ENCOUNTER — Ambulatory Visit (HOSPITAL_COMMUNITY)
Admission: RE | Admit: 2012-12-17 | Discharge: 2012-12-17 | Disposition: A | Discharge status: Home / Self Care | Payer: Self-pay | Source: Ambulatory Visit | Attending: General Surgery | Admitting: General Surgery

## 2012-12-17 ENCOUNTER — Encounter (HOSPITAL_COMMUNITY): Payer: Self-pay

## 2012-12-17 ENCOUNTER — Encounter (HOSPITAL_COMMUNITY)
Admission: RE | Admit: 2012-12-17 | Discharge: 2012-12-17 | Disposition: A | Discharge status: Home / Self Care | Payer: Self-pay | Source: Ambulatory Visit | Attending: General Surgery | Admitting: General Surgery

## 2012-12-17 ENCOUNTER — Other Ambulatory Visit (HOSPITAL_COMMUNITY): Payer: No Typology Code available for payment source

## 2012-12-17 DIAGNOSIS — Z01810 Encounter for preprocedural cardiovascular examination: Secondary | ICD-10-CM | POA: Insufficient documentation

## 2012-12-17 DIAGNOSIS — Z933 Colostomy status: Secondary | ICD-10-CM | POA: Insufficient documentation

## 2012-12-17 DIAGNOSIS — Z01812 Encounter for preprocedural laboratory examination: Secondary | ICD-10-CM | POA: Insufficient documentation

## 2012-12-17 DIAGNOSIS — Z01818 Encounter for other preprocedural examination: Secondary | ICD-10-CM | POA: Insufficient documentation

## 2012-12-17 DIAGNOSIS — J449 Chronic obstructive pulmonary disease, unspecified: Secondary | ICD-10-CM | POA: Insufficient documentation

## 2012-12-17 DIAGNOSIS — J4489 Other specified chronic obstructive pulmonary disease: Secondary | ICD-10-CM | POA: Insufficient documentation

## 2012-12-17 HISTORY — DX: Shortness of breath: R06.02

## 2012-12-17 HISTORY — DX: Cough, unspecified: R05.9

## 2012-12-17 HISTORY — DX: Cough: R05

## 2012-12-17 LAB — CBC
Platelets: 248 10*3/uL (ref 150–400)
RDW: 13.9 % (ref 11.5–15.5)
WBC: 8.6 10*3/uL (ref 4.0–10.5)

## 2012-12-17 LAB — SURGICAL PCR SCREEN: Staphylococcus aureus: NEGATIVE

## 2012-12-17 NOTE — Progress Notes (Addendum)
1 view chest 12-19-11 epic, abnormal will repeat with pst visit ekg 04-23-12 epic

## 2012-12-17 NOTE — Patient Instructions (Addendum)
[address]  [birth year]   Your procedure is scheduled on: 12-23-12  Report to [facility] at 0515 AM.  Call this number if you have problems the morning of surgery [phone]   Remember:   Do not eat food or drink liquids :After Midnight.     Take these medicines the morning of surgery with A SIP OF WATER: no meds to take                                SEE [facility] PREPARING FOR SURGERY SHEET   Do not wear jewelry, make-up or nail polish.  Do not wear lotions, powders, or perfumes. You may wear deodorant.   Men may shave face and neck.  Do not bring valuables to the hospital.  Contacts, dentures or bridgework may not be worn into surgery.  Leave suitcase in the car. After surgery it may be brought to your room.  For patients admitted to the hospital, checkout time is 11:00 AM the day of discharge.   Patients discharged the day of surgery will not be allowed to drive home.  Name and phone number of your driver:  Special Instructions: N/A   Please read over the following fact sheets that you were given: MRSA Information.  Call Cain Sieve RN pre op nurse if needed [phone]    FAILURE TO FOLLOW THESE INSTRUCTIONS MAY RESULT IN THE CANCELLATION OF YOUR SURGERY. PATIENT SIGNATURE___________________________________________

## 2012-12-22 NOTE — Anesthesia Preprocedure Evaluation (Addendum)
Anesthesia Evaluation  Patient identified by MRN, date of birth, ID band Patient awake    Reviewed: Allergy & Precautions, H&P , NPO status , Patient's Chart, lab work & pertinent test results  Airway Mallampati: II TM Distance: >3 FB Neck ROM: full    Dental No notable dental hx. (+) Teeth Intact and Dental Advisory Given   Pulmonary shortness of breath and with exertion, asthma ,  breath sounds clear to auscultation  Pulmonary exam normal       Cardiovascular Exercise Tolerance: Good negative cardio ROS  Rhythm:regular Rate:Normal     Neuro/Psych negative neurological ROS  negative psych ROS   GI/Hepatic negative GI ROS, Neg liver ROS,   Endo/Other  negative endocrine ROS  Renal/GU negative Renal ROS  negative genitourinary   Musculoskeletal   Abdominal   Peds  Hematology negative hematology ROS (+)   Anesthesia Other Findings   Reproductive/Obstetrics negative OB ROS                          Anesthesia Physical Anesthesia Plan  ASA: II  Anesthesia Plan: General   Post-op Pain Management:    Induction: Intravenous  Airway Management Planned: Oral ETT  Additional Equipment:   Intra-op Plan:   Post-operative Plan: Extubation in OR  Informed Consent: I have reviewed the patients History and Physical, chart, labs and discussed the procedure including the risks, benefits and alternatives for the proposed anesthesia with the patient or authorized representative who has indicated his/her understanding and acceptance.   Dental Advisory Given  Plan Discussed with: CRNA and Surgeon  Anesthesia Plan Comments:         Anesthesia Quick Evaluation

## 2012-12-23 ENCOUNTER — Inpatient Hospital Stay (HOSPITAL_COMMUNITY)
Admission: RE | Admit: 2012-12-23 | Discharge: 2012-12-27 | DRG: 331 | Disposition: A | Discharge status: Home / Self Care | Payer: MEDICAID | Source: Ambulatory Visit | Attending: General Surgery | Admitting: General Surgery

## 2012-12-23 ENCOUNTER — Encounter (HOSPITAL_COMMUNITY): Payer: Self-pay | Admitting: Anesthesiology

## 2012-12-23 ENCOUNTER — Encounter (HOSPITAL_COMMUNITY): Payer: Self-pay | Admitting: *Deleted

## 2012-12-23 ENCOUNTER — Ambulatory Visit (HOSPITAL_COMMUNITY): Payer: Self-pay | Admitting: Anesthesiology

## 2012-12-23 ENCOUNTER — Encounter (HOSPITAL_COMMUNITY)
Admission: RE | Disposition: A | Discharge status: Home / Self Care | Payer: Self-pay | Source: Ambulatory Visit | Attending: General Surgery

## 2012-12-23 DIAGNOSIS — Z433 Encounter for attention to colostomy: Principal | ICD-10-CM

## 2012-12-23 DIAGNOSIS — Z79899 Other long term (current) drug therapy: Secondary | ICD-10-CM

## 2012-12-23 DIAGNOSIS — J45909 Unspecified asthma, uncomplicated: Secondary | ICD-10-CM | POA: Diagnosis present

## 2012-12-23 DIAGNOSIS — Z933 Colostomy status: Secondary | ICD-10-CM

## 2012-12-23 DIAGNOSIS — Z8719 Personal history of other diseases of the digestive system: Secondary | ICD-10-CM

## 2012-12-23 DIAGNOSIS — Z8701 Personal history of pneumonia (recurrent): Secondary | ICD-10-CM

## 2012-12-23 DIAGNOSIS — Z9089 Acquired absence of other organs: Secondary | ICD-10-CM

## 2012-12-23 DIAGNOSIS — Z9049 Acquired absence of other specified parts of digestive tract: Secondary | ICD-10-CM

## 2012-12-23 HISTORY — PX: COLOSTOMY TAKEDOWN: SHX5783

## 2012-12-23 SURGERY — CLOSURE, COLOSTOMY
Anesthesia: General | Site: Abdomen | Wound class: Clean Contaminated

## 2012-12-23 MED ORDER — 0.9 % SODIUM CHLORIDE (POUR BTL) OPTIME
TOPICAL | Status: DC | PRN
  Administered 2012-12-23 (×5): 1000 mL

## 2012-12-23 MED ORDER — HYDROMORPHONE HCL PF 1 MG/ML IJ SOLN
0.2500 mg | INTRAMUSCULAR | Status: DC | PRN
  Administered 2012-12-23 (×2): 0.5 mg via INTRAVENOUS

## 2012-12-23 MED ORDER — FENTANYL CITRATE 0.05 MG/ML IJ SOLN
INTRAMUSCULAR | Status: DC | PRN
  Administered 2012-12-23 (×7): 50 ug via INTRAVENOUS

## 2012-12-23 MED ORDER — CISATRACURIUM BESYLATE (PF) 10 MG/5ML IV SOLN
INTRAVENOUS | Status: DC | PRN
  Administered 2012-12-23 (×2): 4 mg via INTRAVENOUS
  Administered 2012-12-23: 2 mg via INTRAVENOUS
  Administered 2012-12-23 (×2): 1 mg via INTRAVENOUS
  Administered 2012-12-23: 2 mg via INTRAVENOUS

## 2012-12-23 MED ORDER — DIPHENHYDRAMINE HCL 50 MG/ML IJ SOLN: 12.5000 mg | Freq: Four times a day (QID) | INTRAMUSCULAR | Status: DC | PRN

## 2012-12-23 MED ORDER — HYDROMORPHONE 0.3 MG/ML IV SOLN
INTRAVENOUS | Status: AC
  Filled 2012-12-23: qty 25

## 2012-12-23 MED ORDER — LACTATED RINGERS IV SOLN: INTRAVENOUS | Status: DC

## 2012-12-23 MED ORDER — LIDOCAINE HCL (CARDIAC) 20 MG/ML IV SOLN
INTRAVENOUS | Status: DC | PRN
  Administered 2012-12-23: 20 mg via INTRAVENOUS

## 2012-12-23 MED ORDER — GLYCOPYRROLATE 0.2 MG/ML IJ SOLN: INTRAMUSCULAR | Status: DC | PRN

## 2012-12-23 MED ORDER — NEOSTIGMINE METHYLSULFATE 1 MG/ML IJ SOLN
INTRAMUSCULAR | Status: DC | PRN
  Administered 2012-12-23: 3 mg via INTRAVENOUS

## 2012-12-23 MED ORDER — NEOSTIGMINE METHYLSULFATE 1 MG/ML IJ SOLN: INTRAMUSCULAR | Status: DC | PRN

## 2012-12-23 MED ORDER — LACTATED RINGERS IV SOLN
INTRAVENOUS | Status: DC | PRN
  Administered 2012-12-23 (×2): via INTRAVENOUS

## 2012-12-23 MED ORDER — ACETAMINOPHEN 10 MG/ML IV SOLN
INTRAVENOUS | Status: AC
  Filled 2012-12-23: qty 100

## 2012-12-23 MED ORDER — DEXTROSE 5 % IV SOLN
2.0000 g | INTRAVENOUS | Status: AC
  Administered 2012-12-23: 2 g via INTRAVENOUS
  Filled 2012-12-23: qty 2

## 2012-12-23 MED ORDER — HYDROMORPHONE HCL PF 1 MG/ML IJ SOLN
INTRAMUSCULAR | Status: DC | PRN
  Administered 2012-12-23 (×2): 0.5 mg via INTRAVENOUS

## 2012-12-23 MED ORDER — PROPOFOL 10 MG/ML IV EMUL
INTRAVENOUS | Status: DC | PRN
  Administered 2012-12-23: 150 mg via INTRAVENOUS
  Administered 2012-12-23: 50 mg via INTRAVENOUS

## 2012-12-23 MED ORDER — ALVIMOPAN 12 MG PO CAPS
12.0000 mg | ORAL_CAPSULE | Freq: Two times a day (BID) | ORAL | Status: DC
  Administered 2012-12-24 – 2012-12-26 (×6): 12 mg via ORAL
  Filled 2012-12-23 (×8): qty 1

## 2012-12-23 MED ORDER — HYDROMORPHONE HCL PF 1 MG/ML IJ SOLN
INTRAMUSCULAR | Status: AC
  Filled 2012-12-23: qty 1

## 2012-12-23 MED ORDER — NALOXONE HCL 0.4 MG/ML IJ SOLN: 0.4000 mg | INTRAMUSCULAR | Status: DC | PRN

## 2012-12-23 MED ORDER — CEFOXITIN SODIUM-DEXTROSE 1-4 GM-% IV SOLR (PREMIX)
INTRAVENOUS | Status: AC
  Filled 2012-12-23: qty 100

## 2012-12-23 MED ORDER — KCL IN DEXTROSE-NACL 20-5-0.9 MEQ/L-%-% IV SOLN
INTRAVENOUS | Status: DC
  Administered 2012-12-23 – 2012-12-26 (×6): via INTRAVENOUS
  Filled 2012-12-23 (×7): qty 1000

## 2012-12-23 MED ORDER — HYDROMORPHONE 0.3 MG/ML IV SOLN
INTRAVENOUS | Status: DC
  Administered 2012-12-23: 0.9 mg via INTRAVENOUS
  Administered 2012-12-23: 0.6 mg via INTRAVENOUS
  Administered 2012-12-23: 0.3 mg via INTRAVENOUS

## 2012-12-23 MED ORDER — ONDANSETRON HCL 4 MG PO TABS: 4.0000 mg | ORAL_TABLET | Freq: Four times a day (QID) | ORAL | Status: DC | PRN

## 2012-12-23 MED ORDER — ONDANSETRON HCL 4 MG/2ML IJ SOLN: 4.0000 mg | Freq: Four times a day (QID) | INTRAMUSCULAR | Status: DC | PRN

## 2012-12-23 MED ORDER — DEXAMETHASONE SODIUM PHOSPHATE 4 MG/ML IJ SOLN
INTRAMUSCULAR | Status: DC | PRN
  Administered 2012-12-23: 10 mg via INTRAVENOUS

## 2012-12-23 MED ORDER — ONDANSETRON HCL 4 MG/2ML IJ SOLN
INTRAMUSCULAR | Status: DC | PRN
  Administered 2012-12-23 (×2): 2 mg via INTRAVENOUS

## 2012-12-23 MED ORDER — ESMOLOL HCL 10 MG/ML IV SOLN
INTRAVENOUS | Status: DC | PRN
  Administered 2012-12-23: 20 mg via INTRAVENOUS

## 2012-12-23 MED ORDER — ALVIMOPAN 12 MG PO CAPS
12.0000 mg | ORAL_CAPSULE | Freq: Once | ORAL | Status: AC
  Administered 2012-12-23: 12 mg via ORAL
  Filled 2012-12-23: qty 1

## 2012-12-23 MED ORDER — EPHEDRINE SULFATE 50 MG/ML IJ SOLN
INTRAMUSCULAR | Status: DC | PRN
  Administered 2012-12-23: 5 mg via INTRAVENOUS

## 2012-12-23 MED ORDER — HYDROMORPHONE HCL PF 1 MG/ML IJ SOLN
0.2500 mg | INTRAMUSCULAR | Status: DC | PRN
  Administered 2012-12-24 (×5): 0.5 mg via INTRAVENOUS
  Filled 2012-12-23 (×5): qty 1

## 2012-12-23 MED ORDER — ONDANSETRON HCL 4 MG/2ML IJ SOLN
4.0000 mg | Freq: Four times a day (QID) | INTRAMUSCULAR | Status: DC | PRN
  Filled 2012-12-23: qty 2

## 2012-12-23 MED ORDER — GLYCOPYRROLATE 0.2 MG/ML IJ SOLN
INTRAMUSCULAR | Status: DC | PRN
  Administered 2012-12-23: 0.2 mg via INTRAVENOUS

## 2012-12-23 MED ORDER — SODIUM CHLORIDE 0.9 % IJ SOLN: 9.0000 mL | INTRAMUSCULAR | Status: DC | PRN

## 2012-12-23 MED ORDER — SUCCINYLCHOLINE CHLORIDE 20 MG/ML IJ SOLN
INTRAMUSCULAR | Status: DC | PRN
  Administered 2012-12-23: 100 mg via INTRAVENOUS

## 2012-12-23 MED ORDER — MIDAZOLAM HCL 5 MG/5ML IJ SOLN
INTRAMUSCULAR | Status: DC | PRN
  Administered 2012-12-23: 1 mg via INTRAVENOUS

## 2012-12-23 MED ORDER — ACETAMINOPHEN 10 MG/ML IV SOLN
INTRAVENOUS | Status: DC | PRN
  Administered 2012-12-23: 1000 mg via INTRAVENOUS

## 2012-12-23 MED ORDER — KCL IN DEXTROSE-NACL 20-5-0.9 MEQ/L-%-% IV SOLN
INTRAVENOUS | Status: AC
  Administered 2012-12-23: 11:00:00
  Filled 2012-12-23: qty 1000

## 2012-12-23 MED ORDER — HEPARIN SODIUM (PORCINE) 5000 UNIT/ML IJ SOLN
5000.0000 [IU] | Freq: Three times a day (TID) | INTRAMUSCULAR | Status: DC
  Administered 2012-12-23 – 2012-12-27 (×11): 5000 [IU] via SUBCUTANEOUS
  Filled 2012-12-23 (×14): qty 1

## 2012-12-23 MED ORDER — DIPHENHYDRAMINE HCL 12.5 MG/5ML PO ELIX: 12.5000 mg | ORAL_SOLUTION | Freq: Four times a day (QID) | ORAL | Status: DC | PRN

## 2012-12-23 SURGICAL SUPPLY — 63 items
APPLICATOR COTTON TIP 6IN STRL (MISCELLANEOUS) ×2 IMPLANT
BLADE EXTENDED COATED 6.5IN (ELECTRODE) ×1 IMPLANT
BLADE HEX COATED 2.75 (ELECTRODE) ×2 IMPLANT
BLADE SURG SZ10 CARB STEEL (BLADE) ×1 IMPLANT
CANISTER SUCTION 2500CC (MISCELLANEOUS) ×2 IMPLANT
CLIP TI LARGE 6 (CLIP) IMPLANT
CLOTH BEACON ORANGE TIMEOUT ST (SAFETY) ×2 IMPLANT
COVER MAYO STAND STRL (DRAPES) ×2 IMPLANT
DRAPE INCISE IOBAN 66X45 STRL (DRAPES) ×1 IMPLANT
DRAPE LAPAROSCOPIC ABDOMINAL (DRAPES) ×2 IMPLANT
DRAPE LG THREE QUARTER DISP (DRAPES) ×2 IMPLANT
DRAPE WARM FLUID 44X44 (DRAPE) ×2 IMPLANT
DRESSING TELFA ISLAND 4X8 (GAUZE/BANDAGES/DRESSINGS) ×1 IMPLANT
DRSG TELFA 4X5 ISLAND ADH (GAUZE/BANDAGES/DRESSINGS) ×1 IMPLANT
ELECT REM PT RETURN 9FT ADLT (ELECTROSURGICAL) ×2
ELECTRODE REM PT RTRN 9FT ADLT (ELECTROSURGICAL) ×1 IMPLANT
GLOVE BIOGEL M 8.0 STRL (GLOVE) ×2 IMPLANT
GLOVE BIOGEL PI IND STRL 7.0 (GLOVE) ×1 IMPLANT
GLOVE BIOGEL PI IND STRL 7.5 (GLOVE) ×1 IMPLANT
GLOVE BIOGEL PI INDICATOR 7.0 (GLOVE)
GLOVE BIOGEL PI INDICATOR 7.5 (GLOVE) ×4
GLOVE SS BIOGEL STRL SZ 7 (GLOVE) IMPLANT
GLOVE SS BIOGEL STRL SZ 7.5 (GLOVE) ×2 IMPLANT
GLOVE SUPERSENSE BIOGEL SZ 7 (GLOVE) ×3
GLOVE SUPERSENSE BIOGEL SZ 7.5 (GLOVE) ×2
GOWN STRL NON-REIN LRG LVL3 (GOWN DISPOSABLE) ×1 IMPLANT
GOWN STRL REIN XL XLG (GOWN DISPOSABLE) ×7 IMPLANT
HAND ACTIVATED (MISCELLANEOUS) IMPLANT
KIT BASIN OR (CUSTOM PROCEDURE TRAY) ×2 IMPLANT
LEGGING LITHOTOMY PAIR STRL (DRAPES) ×1 IMPLANT
LIGASURE IMPACT 36 18CM CVD LR (INSTRUMENTS) IMPLANT
NS IRRIG 1000ML POUR BTL (IV SOLUTION) ×7 IMPLANT
PACK GENERAL/GYN (CUSTOM PROCEDURE TRAY) ×2 IMPLANT
PENCIL BUTTON HOLSTER BLD 10FT (ELECTRODE) ×1 IMPLANT
SCALPEL HARMONIC ACE (MISCELLANEOUS) IMPLANT
SEALER TISSUE G2 CVD JAW 35 (ENDOMECHANICALS) IMPLANT
SEALER TISSUE G2 CVD JAW 45CM (ENDOMECHANICALS)
SPONGE GAUZE 4X4 12PLY (GAUZE/BANDAGES/DRESSINGS) ×2 IMPLANT
STAPLER PROXIMATE 75MM BLUE (STAPLE) ×1 IMPLANT
STAPLER VISISTAT 35W (STAPLE) ×2 IMPLANT
SUCTION POOLE TIP (SUCTIONS) ×2 IMPLANT
SUT NOV 1 T60/GS (SUTURE) IMPLANT
SUT NOVA 1 T20/GS 25DT (SUTURE) IMPLANT
SUT NOVA NAB DX-16 0-1 5-0 T12 (SUTURE) ×3 IMPLANT
SUT NOVA T20/GS 25 (SUTURE) IMPLANT
SUT PDS AB 1 CTX 36 (SUTURE) IMPLANT
SUT PDS AB 1 TP1 96 (SUTURE) ×2 IMPLANT
SUT SILK 2 0 (SUTURE) ×2
SUT SILK 2 0 SH CR/8 (SUTURE) ×5 IMPLANT
SUT SILK 2 0SH CR/8 30 (SUTURE) IMPLANT
SUT SILK 2-0 18XBRD TIE 12 (SUTURE) ×1 IMPLANT
SUT SILK 2-0 30XBRD TIE 12 (SUTURE) IMPLANT
SUT SILK 3 0 (SUTURE)
SUT SILK 3 0 SH CR/8 (SUTURE) ×2 IMPLANT
SUT SILK 3-0 18XBRD TIE 12 (SUTURE) ×2 IMPLANT
SUT VIC AB 3-0 54XBRD REEL (SUTURE) IMPLANT
SUT VIC AB 3-0 BRD 54 (SUTURE)
SYR BULB IRRIGATION 50ML (SYRINGE) ×1 IMPLANT
TOWEL OR 17X26 10 PK STRL BLUE (TOWEL DISPOSABLE) ×3 IMPLANT
TOWEL OR NON WOVEN STRL DISP B (DISPOSABLE) ×1 IMPLANT
TRAY FOLEY CATH 14FRSI W/METER (CATHETERS) ×2 IMPLANT
TUBING CONNECTING 10 (TUBING) ×1 IMPLANT
YANKAUER SUCT BULB TIP NO VENT (SUCTIONS) ×2 IMPLANT

## 2012-12-23 NOTE — Anesthesia Postprocedure Evaluation (Signed)
  Anesthesia Post-op Note  Patient: Christopher Mora  Procedure(s) Performed: Procedure(s) (LRB): COLOSTOMY TAKEDOWN (N/A)  Patient Location: PACU  Anesthesia Type: General  Level of Consciousness: awake and alert   Airway and Oxygen Therapy: Patient Spontanous Breathing  Post-op Pain: mild  Post-op Assessment: Post-op Vital signs reviewed, Patient's Cardiovascular Status Stable, Respiratory Function Stable, Patent Airway and No signs of Nausea or vomiting  Last Vitals:  Filed Vitals:   12/23/12 1100  BP: 149/84  Pulse: 90  Temp:   Resp: 16    Post-op Vital Signs: stable   Complications: No apparent anesthesia complications

## 2012-12-23 NOTE — H&P (Signed)
  Chief complaint: S/P Hartmann colectomy   History: Patient has Hx of emergency Hartmann colectomy for perforated diverticulitis in May of this year. He continues to do well without any abdominal complaints. He presents for takedown of his colostomy  Past Medical History   Diagnosis  Date   .  Asthma    .  Pneumonia     Past Surgical History   Procedure  Date   .  Knee surgery  1973     Left   .  Shoulder surgery  2009   .  Cataract extraction    .  Intraocular lens implant, secondary    .  Knee surgery    .  Shoulder surgery    .  Laparotomy  04/15/2012     Procedure: EXPLORATORY LAPAROTOMY; Surgeon: Mariella Saa, MD; Location: [facility]S; Service: General; Laterality: N/A;   .  Colostomy  04/15/2012     Procedure: COLOSTOMY; Surgeon: Mariella Saa, MD; Location: [facility]; Service: General;;   .  Partial colectomy  04/15/2012     Procedure: PARTIAL COLECTOMY; Surgeon: Mariella Saa, MD; Location: [facility]; Service: General;; sigmoid colectomy   .  Appendectomy  04/15/2012     Procedure: APPENDECTOMY; Surgeon: Mariella Saa, MD; Location: [facility]; Service: General;;    Current Outpatient Prescriptions   Medication  Sig  Dispense  Refill   .  Melatonin 5 MG TABS  Take 1 tablet by mouth at bedtime.      No Known Allergies  Exam:  BP 139/80  Pulse 98  Temp 97.6 F (36.4 C) (Oral)  Resp 18  SpO2 95%  General: Thin but well appearing Caucasian male  Skin: Warm and dry no rash infection  Lungs: Clear equal breath sounds  Cardiac: Regular rate and rhythm. No edema  Abdomen: Healed midline incision. There is a slightly wide wound from secondary healing but no definite hernia. Stoma healthy. No tenderness or masses or organomegaly.   Assessment and plan: Status post Hartmann colectomy for perforated diverticulitis. He presents for colostomy reversal. A preoperative contrast study of his colon and rectum was unremarkable. We have discussed the surgery in detail  including its nature and risks of anesthetic complications, bleeding, infection, anastomotic leak. He has completed a mechanical and antibiotic bowel prep.

## 2012-12-23 NOTE — Op Note (Signed)
Preoperative Diagnosis: status post hartman colostomy   Postoprative Diagnosis: status post hartman colostomy   Procedure: Procedure(s): COLOSTOMY TAKEDOWN   Surgeon: Glenna Fellows T   Assistants: Luretha Murphy MD  Anesthesia:  General endotracheal anesthesia  Indications:   Patient is a 62 year old male status post emergency Hartmann colectomy in May of 2013 for perforated sigmoid diverticulitis. He has done well and desires colostomy takedown. We have discussed the procedure and risks in detail documented elsewhere. Following a mechanical and antibiotic bowel prep at home he is brought to the operating room for colostomy takedown  Procedure Detail:  Patient was brought to the operating room, placed in the supine position on the operating table, and general endotracheal anesthesia was induced. He received preoperative IV antibiotics. PAS port in place. The abdomen was cleaned and then I sutured closed the ostomy in the left lower quadrant at the skin level with a silk pursestring. The patient was placed carefully in lithotomy position with Foley catheter placed and the abdomen and perineum were widely sterilely prepped and draped. Patient, was performed and correct procedure verified. The previous midline incision which had widened was excised and dissection carried down through the subcutaneous tissue to the midline fascia and the peritoneum entered. Fortunately very minimal adhesions. The omentum was dissected away from the anterior abdominal wall and there were just a few filmy interloop small bowel adhesions. The small bowel is packed into the upper abdomen and the rectosigmoid stump easily exposed. The end of the stump was grasped with Allis clamps and using sharp and cautery dissection this was mobilized up out of the pelvis. There appeared to be some thickening of the last several centimeters of the stump and I chose a soft normal-appearing area of the rectosigmoid distal to this and  this was cleaned of mesentery and pericolic fat. The bowel was divided at this point sharply after stay sutures of 2-0 silk were placed in the small remaining mesentery to this segment was divided between clamps and tied with 2 silk ties. The bowel was soft with very good lumen and good blood supply. Following this the colostomy site was elliptically excised and mobilized out of the anterior abdominal wall. The left colon was further mobilized dividing lateral peritoneal attachments. The bowel appeared normal just proximal to the ostomy and reached easily to the pelvis. About this point was divided again sharply with 2 2 silk stay sutures placed proximally and the mesentery divided and the segment removed. Following this an end-to-end anastomosis was created between the left colon the rectosigmoid full-thickness inverted 2-0 silk sutures. This was under no tension with good blood supply. Following this Dr. Daphine Deutscher went below and insufflated the rectum with the sigmoidoscope and with the proximal bowel clamped and under saline irrigation and the anastomosis distended with air there was no evidence of leak. Following this all gloves and instruments and gowns  were changed.  The abdomen was thoroughly irrigated with saline. Hemostasis was assured. The fascia at the midline incision was mobilized slightly away from the skin with cautery to allow easy closure. The ostomy site the fascia was then closed full-thickness abdominal wall transversely with interrupted #1 Novafils. The midline fascia was closed with looped running #1 PDS begun at the very end of the incision with a few interrupted internal stay sutures of #1 Novafil. The subcutaneous tissue was irrigated anabolic solution and the skin closed with staples. Sponge needle and instrument counts were correct.   Estimated Blood Loss:  less than 50 mL  Drains: nnone  Blood Given: none          Specimens: colostomy and portion of rectosigmoid          Complications:  * No complications entered in OR log *         Disposition: PACU - hemodynamically stable.         Condition: stable

## 2012-12-23 NOTE — Transfer of Care (Signed)
Immediate Anesthesia Transfer of Care Note  Patient: Christopher Mora  Procedure(s) Performed: Procedure(s) (LRB) with comments: COLOSTOMY TAKEDOWN (N/A) - TAKEDOWN OF HARTMAN COLOSTOMY   Patient Location: PACU  Anesthesia Type:General  Level of Consciousness: awake and sedated  Airway & Oxygen Therapy: Patient Spontanous Breathing, Patient connected to face mask oxygen and Patient connected to face mask  Post-op Assessment: Report given to PACU RN and Post -op Vital signs reviewed and stable  Post vital signs: Reviewed and stable  Complications: No apparent anesthesia complications

## 2012-12-24 ENCOUNTER — Encounter (HOSPITAL_COMMUNITY): Payer: Self-pay | Admitting: General Surgery

## 2012-12-24 LAB — BASIC METABOLIC PANEL
BUN: 7 mg/dL (ref 6–23)
Creatinine, Ser: 0.62 mg/dL (ref 0.50–1.35)
GFR calc Af Amer: >90 mL/min (ref >90)
GFR calc non Af Amer: >90 mL/min (ref >90)

## 2012-12-24 LAB — CBC
HCT: 41.2 % (ref 39.0–52.0)
MCHC: 34.2 g/dL (ref 30.0–36.0)
MCV: 93 fL (ref 78.0–100.0)
Platelets: 228 10*3/uL (ref 150–400)
RDW: 13.8 % (ref 11.5–15.5)

## 2012-12-24 NOTE — Progress Notes (Signed)
Patient ID: Christopher Mora, male   DOB: 1951-03-05, 62 y.o.   MRN: [id number] 1 Day Post-Op  Subjective: Some incisional pain when coughing but pretty well controlled with meds. Denies nausea. Has been up out of bed.  Objective: Vital signs in last 24 hours: Temp:  [97.6 F (36.4 C)-98.6 F (37 C)] 98.6 F (37 C) (01/28 1000) Pulse Rate:  [57-90] 67  (01/28 1000) Resp:  [10-16] 16  (01/28 1000) BP: (114-174)/(64-93) 150/83 mmHg (01/28 1000) SpO2:  [90 %-96 %] 92 % (01/28 1000) Weight:  [137 lb (62.143 kg)] 137 lb (62.143 kg) (01/27 1238) Last BM Date: 12/22/12  Intake/Output from previous day: 01/27 0701 - 01/28 0700 In: 4287.5 [I.V.:4287.5] Out: 1345 [Urine:1335; Blood:10] Intake/Output this shift:    General appearance: alert, cooperative and no distress Resp: rhonchi bilaterally and mild GI: firm, mild diffuse tenderness appropriately, nondistended Incision/Wound: dressings clean and dry  Lab Results:   [facility] 12/24/12 0419  WBC 14.2*  HGB 14.1  HCT 41.2  PLT 228   BMET  Basename 12/24/12 0419  NA 136  K 4.3  CL 105  CO2 23  GLUCOSE 140*  BUN 7  CREATININE 0.62  CALCIUM 8.2*     Studies/Results: No results found.  Anti-infectives: Anti-infectives     Start     Dose/Rate Route Frequency Ordered Stop   12/23/12 0517   cefOXitin (MEFOXIN) 2 g in dextrose 5 % 50 mL IVPB        2 g 100 mL/hr over 30 Minutes Intravenous On call to O.R. 12/23/12 0517 12/23/12 0730          Assessment/Plan: s/p Procedure(s): COLOSTOMY TAKEDOWN Stable postoperatively, doing well On Entereg Start clear liquid diet Pulmonary toilet encouraged.   LOS: 1 day    Brannon Levene T 12/24/2012

## 2012-12-24 NOTE — Care Management Note (Signed)
    Page 1 of 1   12/24/2012     12:32:37 PM   CARE MANAGEMENT NOTE 12/24/2012  Patient:  Christopher Mora, Christopher Mora   Account Number:  [id number]  Date Initiated:  12/24/2012  Documentation initiated by:  Lorenda Ishihara  Subjective/Objective Assessment:   62 yo male admitted s/p colostomy takedown. PTA lived at home with significant other.     Action/Plan:   Home when stable   Anticipated DC Date:  12/27/2012   Anticipated DC Plan:  HOME/SELF CARE      DC Planning Services  CM consult      Choice offered to / List presented to:             Status of service:  Completed, signed off Medicare Important Message given?   (If response is "NO", the following Medicare IM given date fields will be blank) Date Medicare IM given:   Date Additional Medicare IM given:    Discharge Disposition:  HOME/SELF CARE  Per UR Regulation:  Reviewed for med. necessity/level of care/duration of stay  If discussed at Long Length of Stay Meetings, dates discussed:    Comments:

## 2012-12-25 LAB — CBC
MCHC: 34.8 g/dL (ref 30.0–36.0)
Platelets: 225 10*3/uL (ref 150–400)
RDW: 13.7 % (ref 11.5–15.5)
WBC: 13.2 10*3/uL — ABNORMAL HIGH (ref 4.0–10.5)

## 2012-12-25 LAB — BASIC METABOLIC PANEL
BUN: 5 mg/dL — ABNORMAL LOW (ref 6–23)
GFR calc Af Amer: >90 mL/min (ref >90)
GFR calc non Af Amer: >90 mL/min (ref >90)
Potassium: 3.9 mEq/L (ref 3.5–5.1)
Sodium: 134 mEq/L — ABNORMAL LOW (ref 135–145)

## 2012-12-25 MED ORDER — OXYCODONE-ACETAMINOPHEN 5-325 MG/5ML PO SOLN
5.0000 mL | ORAL | Status: DC | PRN
  Administered 2012-12-26: 5 mL via ORAL
  Filled 2012-12-25: qty 5

## 2012-12-25 NOTE — Progress Notes (Signed)
Patient ID: Christopher Mora, male   DOB: 1951-06-23, 62 y.o.   MRN: [id number] 2 Days Post-Op  Subjective: No C/O, denies pain, no pain meds yet today.  Tol CL without nausea. Ambulatory No flatus or BM yet  Objective: Vital signs in last 24 hours: Temp:  [98.7 F (37.1 C)-99.2 F (37.3 C)] 98.7 F (37.1 C) (01/29 0605) Pulse Rate:  [78-86] 86  (01/29 0605) Resp:  [18] 18  (01/29 0605) BP: (149-155)/(86-87) 153/87 mmHg (01/29 0605) SpO2:  [92 %] 92 % (01/29 0605) Last BM Date: 12/22/12  Intake/Output from previous day: 01/28 0701 - 01/29 0700 In: 1985.4 [I.V.:1985.4] Out: 3600 [Urine:3600] Intake/Output this shift: Total I/O In: 302.5 [I.V.:302.5] Out: 1550 [Urine:1550]  General appearance: alert, cooperative and no distress GI: normal findings: bowel sounds normal and soft, non-tender Incision/Wound: Dressings clean and dry  Lab Results:   Basename 12/25/12 0400 12/24/12 0419  WBC 13.2* 14.2*  HGB 14.7 14.1  HCT 42.2 41.2  PLT 225 228   BMET  Basename 12/25/12 0400 12/24/12 0419  NA 134* 136  K 3.9 4.3  CL 102 105  CO2 22 23  GLUCOSE 118* 140*  BUN 5* 7  CREATININE 0.58 0.62  CALCIUM 8.2* 8.2*     Studies/Results: No results found.  Anti-infectives: Anti-infectives     Start     Dose/Rate Route Frequency Ordered Stop   12/23/12 0517   cefOXitin (MEFOXIN) 2 g in dextrose 5 % 50 mL IVPB        2 g 100 mL/hr over 30 Minutes Intravenous On call to O.R. 12/23/12 0517 12/23/12 0730          Assessment/Plan: s/p Procedure(s): COLOSTOMY TAKEDOWN Doing well without complication Advance to FL diet  LOS: 2 days    Avamarie Crossley T 12/25/2012

## 2012-12-26 MED ORDER — KCL IN DEXTROSE-NACL 20-5-0.9 MEQ/L-%-% IV SOLN
INTRAVENOUS | Status: DC
  Administered 2012-12-26: 14:00:00 via INTRAVENOUS
  Filled 2012-12-26 (×2): qty 1000

## 2012-12-26 NOTE — Progress Notes (Signed)
Patient ID: Christopher Mora, male   DOB: 1951/10/18, 62 y.o.   MRN: [id number] 3 Days Post-Op  Subjective: Feels "fine".  Denies pain or nausea.  Tol FL well. + flatus, no BM  Objective: Vital signs in last 24 hours: Temp:  [98 F (36.7 C)-98.9 F (37.2 C)] 98 F (36.7 C) (01/30 0541) Pulse Rate:  [80-94] 93  (01/30 0541) Resp:  [16] 16  (01/30 0541) BP: (152-168)/(84-92) 160/92 mmHg (01/30 0541) SpO2:  [92 %-93 %] 93 % (01/30 0541) Last BM Date: 12/22/12  Intake/Output from previous day: 01/29 0701 - 01/30 0700 In: 2213.8 [P.O.:360; I.V.:1853.8] Out: 2350 [Urine:2350] Intake/Output this shift: Total I/O In: 951.3 [I.V.:951.3] Out: 0   General appearance: alert, cooperative and no distress GI: normal findings: soft, non-tender Incision/Wound: Dressings clean and dry  Lab Results:   Basename 12/25/12 0400 12/24/12 0419  WBC 13.2* 14.2*  HGB 14.7 14.1  HCT 42.2 41.2  PLT 225 228   BMET  Basename 12/25/12 0400 12/24/12 0419  NA 134* 136  K 3.9 4.3  CL 102 105  CO2 22 23  GLUCOSE 118* 140*  BUN 5* 7  CREATININE 0.58 0.62  CALCIUM 8.2* 8.2*     Studies/Results: No results found.  Anti-infectives: Anti-infectives     Start     Dose/Rate Route Frequency Ordered Stop   12/23/12 0517   cefOXitin (MEFOXIN) 2 g in dextrose 5 % 50 mL IVPB        2 g 100 mL/hr over 30 Minutes Intravenous On call to O.R. 12/23/12 0517 12/23/12 0730          Assessment/Plan: s/p Procedure(s): COLOSTOMY TAKEDOWN Continues to do well Advance to regular diet Anticipate discharge tomorrow   LOS: 3 days    Leighton Brickley T 12/26/2012

## 2012-12-27 ENCOUNTER — Telehealth (INDEPENDENT_AMBULATORY_CARE_PROVIDER_SITE_OTHER): Payer: Self-pay

## 2012-12-27 MED ORDER — OXYCODONE-ACETAMINOPHEN 5-325 MG PO TABS: 1.0000 | ORAL_TABLET | ORAL | Status: DC | PRN

## 2012-12-27 NOTE — Progress Notes (Signed)
Patient ID: Christopher Mora, male   DOB: Jun 10, 1951, 62 y.o.   MRN: [id number] 4 Days Post-Op  Subjective: No complaints this morning. He is tolerating his regular diet without any difficulty. He is passing a lot of flatus but no bowel movement yet.  Objective: Vital signs in last 24 hours: Temp:  [98.6 F (37 C)-99 F (37.2 C)] 98.9 F (37.2 C) (01/31 0530) Pulse Rate:  [74-89] 89  (01/31 0530) Resp:  [16-18] 18  (01/30 2051) BP: (134-144)/(83-89) 144/83 mmHg (01/31 0530) SpO2:  [93 %-98 %] 94 % (01/31 0530) Last BM Date: 12/22/12  Intake/Output from previous day: 01/30 0701 - 01/31 0700 In: 1352.1 [P.O.:140; I.V.:1212.1] Out: -  Intake/Output this shift:    General appearance: alert, cooperative and no distress GI: normal findings: soft, non-tender Incision/Wound: clean and dry without infection  Lab Results:   Basename 12/25/12 0400  WBC 13.2*  HGB 14.7  HCT 42.2  PLT 225   BMET  Basename 12/25/12 0400  NA 134*  K 3.9  CL 102  CO2 22  GLUCOSE 118*  BUN 5*  CREATININE 0.58  CALCIUM 8.2*     Studies/Results: No results found.  Anti-infectives: Anti-infectives     Start     Dose/Rate Route Frequency Ordered Stop   12/23/12 0517   cefOXitin (MEFOXIN) 2 g in dextrose 5 % 50 mL IVPB        2 g 100 mL/hr over 30 Minutes Intravenous On call to O.R. 12/23/12 0517 12/23/12 0730          Assessment/Plan: s/p Procedure(s): COLOSTOMY TAKEDOWN Doing well without apparent complication. He very much once go home and I believe is stable for discharge.   LOS: 4 days    Kiyo Heal T 12/27/2012

## 2012-12-27 NOTE — Discharge Summary (Signed)
  Patient ID: Christopher Mora [id number] 62 y.o. 08-20-1951  12/23/2012  Discharge date and time: 12/27/2012   Admitting Physician: Glenna Fellows T  Discharge Physician: Glenna Fellows T  Admission Diagnoses: status post hartman colostomy   Discharge Diagnoses: same  Operations: Procedure(s): COLOSTOMY TAKEDOWN  Admission Condition: good  Discharged Condition: good  Indication for Admission: patient is a 62 year old male approximately 9 months following emergency Hartmann colectomy for perforated diverticulitis. He has done well and is electively admitted for takedown of his [facility] Course: patient underwent an uneventful colostomy takedown on the day of admission. His postoperative course was without complication. His pain was well-controlled on IV and then oral medications. His diet was gradually advanced to a regular diet which time of discharge he is tolerating well. He is afebrile with normal vital signs. Abdomen is soft and nontender incision is healing without apparent infection. He is passing a lot of flatus but has not yet had a bowel movement.   Disposition: Home  Patient Instructions:   Christopher Mora, Christopher Mora  Home Medication Instructions [id number]   Printed on:12/27/12 0822  Medication Information                    Melatonin 5 MG TABS Take 1 tablet by mouth at bedtime.           oxyCODONE-acetaminophen (ROXICET) 5-325 MG per tablet Take 1 tablet by mouth every 4 (four) hours as needed for pain.             Activity: no heavy lifting for 4 weeks Diet: regular diet Wound Care: none needed  Follow-up:  With [name] in 1 week.  Signed: Mariella Saa MD, FACS  12/27/2012, 8:22 AM

## 2012-12-27 NOTE — Telephone Encounter (Signed)
The pt called to schedule a nurse visit for next week per Dr Jamse Mead instructions for staple removal.  I scheduled him for 2/7 at 2pm.  Please call him if this time doesn't work for you.

## 2013-01-03 ENCOUNTER — Ambulatory Visit (INDEPENDENT_AMBULATORY_CARE_PROVIDER_SITE_OTHER): Payer: Self-pay | Admitting: General Surgery

## 2013-01-03 ENCOUNTER — Encounter (INDEPENDENT_AMBULATORY_CARE_PROVIDER_SITE_OTHER): Payer: Self-pay | Admitting: General Surgery

## 2013-01-03 VITALS — BP 142/82 | HR 89 | Temp 99.4°F | Resp 14 | Ht 68.0 in | Wt 136.2 lb

## 2013-01-03 DIAGNOSIS — Z4802 Encounter for removal of sutures: Secondary | ICD-10-CM

## 2013-01-03 NOTE — Patient Instructions (Signed)
Patient came in today to have staple removal, I removed all the staples and placed steri strip over the wound and the surgery site looked good, no redness. Patient stated that he is feeling well. Neysa Bonito made apt on 01-10-13 to come back to see Dr Johna Sheriff

## 2013-01-10 ENCOUNTER — Encounter (INDEPENDENT_AMBULATORY_CARE_PROVIDER_SITE_OTHER): Payer: Self-pay | Admitting: General Surgery

## 2017-02-05 DIAGNOSIS — J431 Panlobular emphysema: Secondary | ICD-10-CM | POA: Diagnosis not present

## 2017-02-05 DIAGNOSIS — F418 Other specified anxiety disorders: Secondary | ICD-10-CM | POA: Diagnosis not present

## 2017-02-05 DIAGNOSIS — Z23 Encounter for immunization: Secondary | ICD-10-CM | POA: Diagnosis not present

## 2017-02-05 DIAGNOSIS — F17210 Nicotine dependence, cigarettes, uncomplicated: Secondary | ICD-10-CM | POA: Diagnosis not present

## 2017-02-05 DIAGNOSIS — J439 Emphysema, unspecified: Secondary | ICD-10-CM | POA: Diagnosis not present

## 2017-02-05 DIAGNOSIS — R0609 Other forms of dyspnea: Secondary | ICD-10-CM | POA: Diagnosis not present

## 2017-07-16 DIAGNOSIS — R030 Elevated blood-pressure reading, without diagnosis of hypertension: Secondary | ICD-10-CM | POA: Diagnosis not present

## 2017-07-16 DIAGNOSIS — H6121 Impacted cerumen, right ear: Secondary | ICD-10-CM | POA: Diagnosis not present

## 2017-07-16 DIAGNOSIS — J431 Panlobular emphysema: Secondary | ICD-10-CM | POA: Diagnosis not present

## 2017-12-19 DIAGNOSIS — H524 Presbyopia: Secondary | ICD-10-CM | POA: Diagnosis not present

## 2017-12-19 DIAGNOSIS — H40053 Ocular hypertension, bilateral: Secondary | ICD-10-CM | POA: Diagnosis not present

## 2017-12-19 DIAGNOSIS — H52223 Regular astigmatism, bilateral: Secondary | ICD-10-CM | POA: Diagnosis not present

## 2017-12-19 DIAGNOSIS — H40013 Open angle with borderline findings, low risk, bilateral: Secondary | ICD-10-CM | POA: Diagnosis not present

## 2017-12-19 DIAGNOSIS — H5203 Hypermetropia, bilateral: Secondary | ICD-10-CM | POA: Diagnosis not present

## 2018-01-02 DIAGNOSIS — H3589 Other specified retinal disorders: Secondary | ICD-10-CM | POA: Diagnosis not present

## 2018-01-02 DIAGNOSIS — H40053 Ocular hypertension, bilateral: Secondary | ICD-10-CM | POA: Diagnosis not present

## 2018-01-02 DIAGNOSIS — H40052 Ocular hypertension, left eye: Secondary | ICD-10-CM | POA: Diagnosis not present

## 2018-01-02 DIAGNOSIS — H5340 Unspecified visual field defects: Secondary | ICD-10-CM | POA: Diagnosis not present

## 2018-01-02 DIAGNOSIS — H40013 Open angle with borderline findings, low risk, bilateral: Secondary | ICD-10-CM | POA: Diagnosis not present

## 2018-01-12 ENCOUNTER — Encounter (HOSPITAL_COMMUNITY): Payer: Self-pay | Admitting: Emergency Medicine

## 2018-01-12 ENCOUNTER — Emergency Department (HOSPITAL_COMMUNITY)
Admission: EM | Admit: 2018-01-12 | Discharge: 2018-01-12 | Disposition: A | Discharge status: Home / Self Care | Payer: PPO | Attending: Emergency Medicine | Admitting: Emergency Medicine

## 2018-01-12 ENCOUNTER — Emergency Department (HOSPITAL_COMMUNITY): Payer: PPO

## 2018-01-12 DIAGNOSIS — J441 Chronic obstructive pulmonary disease with (acute) exacerbation: Secondary | ICD-10-CM | POA: Diagnosis not present

## 2018-01-12 DIAGNOSIS — J45909 Unspecified asthma, uncomplicated: Secondary | ICD-10-CM | POA: Diagnosis not present

## 2018-01-12 DIAGNOSIS — F17290 Nicotine dependence, other tobacco product, uncomplicated: Secondary | ICD-10-CM | POA: Diagnosis not present

## 2018-01-12 DIAGNOSIS — Z79899 Other long term (current) drug therapy: Secondary | ICD-10-CM | POA: Insufficient documentation

## 2018-01-12 DIAGNOSIS — R05 Cough: Secondary | ICD-10-CM | POA: Diagnosis not present

## 2018-01-12 DIAGNOSIS — R0602 Shortness of breath: Secondary | ICD-10-CM | POA: Diagnosis not present

## 2018-01-12 DIAGNOSIS — Z720 Tobacco use: Secondary | ICD-10-CM | POA: Diagnosis not present

## 2018-01-12 LAB — CBC WITH DIFFERENTIAL/PLATELET
Basophils Absolute: 0 10*3/uL (ref 0.0–0.1)
Basophils Relative: 0 %
Eosinophils Absolute: 0 10*3/uL (ref 0.0–0.7)
Eosinophils Relative: 0 %
HCT: 42.1 % (ref 39.0–52.0)
Hemoglobin: 14.6 g/dL (ref 13.0–17.0)
LYMPHS ABS: 0.8 10*3/uL (ref 0.7–4.0)
LYMPHS PCT: 9 %
MCH: 31.9 pg (ref 26.0–34.0)
MCHC: 34.7 g/dL (ref 30.0–36.0)
MCV: 92.1 fL (ref 78.0–100.0)
MONO ABS: 0.7 10*3/uL (ref 0.1–1.0)
Monocytes Relative: 9 %
Neutro Abs: 6.8 10*3/uL (ref 1.7–7.7)
Neutrophils Relative %: 82 %
Platelets: 182 10*3/uL (ref 150–400)
RBC: 4.57 MIL/uL (ref 4.22–5.81)
RDW: 13.5 % (ref 11.5–15.5)
WBC: 8.3 10*3/uL (ref 4.0–10.5)

## 2018-01-12 LAB — COMPREHENSIVE METABOLIC PANEL
ALBUMIN: 3.9 g/dL (ref 3.5–5.0)
ALT: 17 U/L (ref 17–63)
AST: 27 U/L (ref 15–41)
Alkaline Phosphatase: 61 U/L (ref 38–126)
Anion gap: 11 (ref 5–15)
BILIRUBIN TOTAL: 1 mg/dL (ref 0.3–1.2)
BUN: 13 mg/dL (ref 6–20)
CO2: 23 mmol/L (ref 22–32)
CREATININE: 0.72 mg/dL (ref 0.61–1.24)
Calcium: 8.4 mg/dL — ABNORMAL LOW (ref 8.9–10.3)
Chloride: 101 mmol/L (ref 101–111)
GFR calc Af Amer: >60 mL/min (ref >60)
GFR calc non Af Amer: >60 mL/min (ref >60)
GLUCOSE: 108 mg/dL — AB (ref 65–99)
POTASSIUM: 4 mmol/L (ref 3.5–5.1)
Sodium: 135 mmol/L (ref 135–145)
TOTAL PROTEIN: 6.7 g/dL (ref 6.5–8.1)

## 2018-01-12 LAB — TROPONIN I: Troponin I: <0.03 ng/mL (ref <0.03)

## 2018-01-12 LAB — BRAIN NATRIURETIC PEPTIDE: B Natriuretic Peptide: 127.5 pg/mL — ABNORMAL HIGH (ref 0.0–100.0)

## 2018-01-12 MED ORDER — ALBUTEROL SULFATE (2.5 MG/3ML) 0.083% IN NEBU
5.0000 mg | INHALATION_SOLUTION | Freq: Once | RESPIRATORY_TRACT | Status: AC
  Administered 2018-01-12: 5 mg via RESPIRATORY_TRACT
  Filled 2018-01-12: qty 6

## 2018-01-12 MED ORDER — PREDNISONE 20 MG PO TABS: 40.0000 mg | ORAL_TABLET | Freq: Every day | ORAL | 0 refills | Status: DC

## 2018-01-12 MED ORDER — ALBUTEROL SULFATE (2.5 MG/3ML) 0.083% IN NEBU: 5.0000 mg | INHALATION_SOLUTION | Freq: Once | RESPIRATORY_TRACT | Status: DC

## 2018-01-12 NOTE — ED Triage Notes (Signed)
Patient here from home with complaints of SOB that started 2 days ago. Headache. Hx of COPD, reports that he has never been this SOB before.

## 2018-01-12 NOTE — ED Provider Notes (Signed)
[facility] DEPT Provider Note   CSN: [id number] Arrival date & time: 01/12/18  1711     History   Chief Complaint Chief Complaint  Patient presents with  . Shortness of Breath  . Dizziness    HPI Christopher Mora is a 67 y.o. male.  HPI Patient presents with concern of dyspnea. This episode began about 2 days ago, since onset has become worse. Patient has a history of COPD, denies history of cardiac disease. He continues to smoke cigarettes. Since onset, no relief in spite of using home medication. No clear exacerbating factors. No focal pain, no fever, minimal cough. Patient is here with his wife who assists with the HPI. Past Medical History:  Diagnosis Date  . Asthma   . Cough "for a while"   nonproductive  . Pneumonia dec 2012  . Shortness of breath    occasional    Patient Active Problem List   Diagnosis Date Noted  . Perforated diverticulitis 05/02/2012    Past Surgical History:  Procedure Laterality Date  . APPENDECTOMY  04/15/2012   Procedure: APPENDECTOMY;  Surgeon: Edward Jolly, MD;  Location: [facility];  Service: General;;  . CATARACT EXTRACTION    . COLON SURGERY    . COLOSTOMY  04/15/2012   Procedure: COLOSTOMY;  Surgeon: Edward Jolly, MD;  Location: [facility];  Service: General;;  . COLOSTOMY TAKEDOWN  12/23/2012   Procedure: COLOSTOMY TAKEDOWN;  Surgeon: Edward Jolly, MD;  Location: [facility];  Service: General;  Laterality: N/A;  TAKEDOWN OF HARTMAN COLOSTOMY   . INTRAOCULAR LENS IMPLANT, SECONDARY    . KNEE SURGERY  1973   Left  . KNEE SURGERY    . LAPAROTOMY  04/15/2012   Procedure: EXPLORATORY LAPAROTOMY;  Surgeon: Edward Jolly, MD;  Location: [facility];  Service: General;  Laterality: N/A;  . PARTIAL COLECTOMY  04/15/2012   Procedure: PARTIAL COLECTOMY;  Surgeon: Edward Jolly, MD;  Location: [facility];  Service: General;;  sigmoid colectomy  . SHOULDER SURGERY  2009  . SHOULDER SURGERY          Home Medications    Prior to Admission medications   Medication Sig Start Date End Date Taking? Authorizing Provider  buPROPion (WELLBUTRIN SR) 150 MG 12 hr tablet Take 150 mg by mouth 2 (two) times daily. 11/16/17  Yes [provider]  Fluticasone-Salmeterol (ADVAIR DISKUS) 250-50 MCG/DOSE AEPB Inhale 1 puff into the lungs 2 (two) times daily. 02/05/17  Yes [provider]  LORazepam (ATIVAN) 0.5 MG tablet Take 0.5 mg by mouth 3 (three) times daily as needed for anxiety. 11/16/17  Yes [provider]  PROAIR HFA 108 (90 Base) MCG/ACT inhaler Inhale 2 puffs into the lungs every 6 (six) hours as needed for wheezing or shortness of breath. 01/01/18  Yes [provider]    Family History Family History  Problem Relation Age of Onset  . Alzheimer's disease Father     Social History Social History   Tobacco Use  . Smoking status: Current Every Day Smoker    Packs/day: 5.00    Years: 40.00    Pack years: 200.00    Types: Cigars  . Smokeless tobacco: Never Used  Substance Use Topics  . Alcohol use: No  . Drug use: Yes    Types: Marijuana    Comment: occasional marijuana     Allergies   Patient has no known allergies.   Review of Systems Review of Systems  Constitutional:  Per HPI, otherwise negative  HENT:       Per HPI, otherwise negative  Respiratory:       Per HPI, otherwise negative  Cardiovascular:       Per HPI, otherwise negative  Gastrointestinal: Negative for vomiting.  Endocrine:       Negative aside from HPI  Genitourinary:       Neg aside from HPI   Musculoskeletal:       Per HPI, otherwise negative  Skin: Negative.   Neurological: Negative for syncope.     Physical Exam Updated Vital Signs BP (!) 149/83 (BP Location: Left Arm)   Pulse 100   Temp 98.4 F (36.9 C) (Oral)   Resp 17   SpO2 99%   Physical Exam  Constitutional: He is oriented to person, place, and time. He appears well-developed.  No distress.  HENT:  Head: Normocephalic and atraumatic.  Eyes: Conjunctivae and EOM are normal.  Cardiovascular: Regular rhythm.  Tachycardic  Pulmonary/Chest: He has decreased breath sounds. He has wheezes.  Abdominal: He exhibits no distension.  Musculoskeletal: He exhibits no edema.  Neurological: He is alert and oriented to person, place, and time.  Skin: Skin is warm and dry.  Psychiatric: He has a normal mood and affect.  Nursing note and vitals reviewed.    ED Treatments / Results  Labs (all labs ordered are listed, but only abnormal results are displayed) Labs Reviewed  COMPREHENSIVE METABOLIC PANEL - Abnormal; Notable for the following components:      Result Value   Glucose, Bld 108 (*)    Calcium 8.4 (*)    All other components within normal limits  BRAIN NATRIURETIC PEPTIDE - Abnormal; Notable for the following components:   B Natriuretic Peptide 127.5 (*)    All other components within normal limits  CBC WITH DIFFERENTIAL/PLATELET  TROPONIN I    EKG  EKG Interpretation  Date/Time:  Saturday January 12 2018 19:03:40 EST Ventricular Rate:  98 PR Interval:    QRS Duration: 91 QT Interval:  335 QTC Calculation: 428 R Axis:   94 Text Interpretation:  Sinus rhythm Right axis deviation Minimal ST depression, inferior leads Abnormal ekg Confirmed by Carmin Muskrat [phone]) on 01/12/2018 8:42:42 PM       Radiology Dg Chest 2 View  Result Date: 01/12/2018 CLINICAL DATA:  67 y/o M; 2 days of shortness of breath. History of COPD. EXAM: CHEST  2 VIEW COMPARISON:  15-Jan-202014 chest radiograph. FINDINGS: Normal cardiac silhouette. Aortic atherosclerosis with calcification. Stable advanced findings of COPD with hyperinflation and emphysema. No focal consolidation. Stable chronic blunting of costal diaphragmatic angles. No pleural effusion or pneumothorax. No acute osseous abnormality is evident. IMPRESSION: Advanced COPD. Aortic atherosclerosis. No acute pulmonary  process identified. Electronically Signed   By: Kristine Garbe M.D.   On: 01/12/2018 18:23    Procedures Procedures (including critical care time)  Medications Ordered in ED Medications  albuterol (PROVENTIL) (2.5 MG/3ML) 0.083% nebulizer solution 5 mg (5 mg Nebulization Given 01/12/18 1926)     Initial Impression / Assessment and Plan / ED Course  I have reviewed the triage vital signs and the nursing notes.  Pertinent labs & imaging results that were available during my care of the patient were reviewed by me and considered in my medical decision making (see chart for details).     8:42 PM Patient appears better, sounds better.  9:14 PM Patient much more comfortable.  We discussed all findings, and again discussed options for smoking  cessation, including medications, therapy, gum.  With no evidence for pneumonia, no evidence for ACS, and his substantial improvement, there is suspicion for COPD exacerbation causing his difficulty breathing. Given this improvement, the patient was discharged in stable condition to follow-up with primary care.  Final Clinical Impressions(s) / ED Diagnoses   Final diagnoses:  COPD exacerbation [facility])    ED Discharge Orders        Ordered    predniSONE (DELTASONE) 20 MG tablet  Daily with breakfast     01/12/18 2115       Carmin Muskrat, MD 01/12/18 2115

## 2018-02-08 DIAGNOSIS — J431 Panlobular emphysema: Secondary | ICD-10-CM | POA: Diagnosis not present

## 2018-02-20 DIAGNOSIS — J449 Chronic obstructive pulmonary disease, unspecified: Secondary | ICD-10-CM | POA: Diagnosis not present

## 2018-02-20 DIAGNOSIS — J431 Panlobular emphysema: Secondary | ICD-10-CM | POA: Diagnosis not present

## 2018-03-23 DIAGNOSIS — J431 Panlobular emphysema: Secondary | ICD-10-CM | POA: Diagnosis not present

## 2018-03-23 DIAGNOSIS — J449 Chronic obstructive pulmonary disease, unspecified: Secondary | ICD-10-CM | POA: Diagnosis not present

## 2018-04-22 DIAGNOSIS — J449 Chronic obstructive pulmonary disease, unspecified: Secondary | ICD-10-CM | POA: Diagnosis not present

## 2018-04-22 DIAGNOSIS — J431 Panlobular emphysema: Secondary | ICD-10-CM | POA: Diagnosis not present

## 2018-05-23 DIAGNOSIS — J431 Panlobular emphysema: Secondary | ICD-10-CM | POA: Diagnosis not present

## 2018-05-23 DIAGNOSIS — J449 Chronic obstructive pulmonary disease, unspecified: Secondary | ICD-10-CM | POA: Diagnosis not present

## 2018-06-17 DIAGNOSIS — Z23 Encounter for immunization: Secondary | ICD-10-CM | POA: Diagnosis not present

## 2018-06-17 DIAGNOSIS — Z0000 Encounter for general adult medical examination without abnormal findings: Secondary | ICD-10-CM | POA: Diagnosis not present

## 2018-06-22 DIAGNOSIS — J431 Panlobular emphysema: Secondary | ICD-10-CM | POA: Diagnosis not present

## 2018-06-22 DIAGNOSIS — J449 Chronic obstructive pulmonary disease, unspecified: Secondary | ICD-10-CM | POA: Diagnosis not present

## 2018-07-23 DIAGNOSIS — J431 Panlobular emphysema: Secondary | ICD-10-CM | POA: Diagnosis not present

## 2018-07-23 DIAGNOSIS — J449 Chronic obstructive pulmonary disease, unspecified: Secondary | ICD-10-CM | POA: Diagnosis not present

## 2018-08-14 DIAGNOSIS — N529 Male erectile dysfunction, unspecified: Secondary | ICD-10-CM | POA: Diagnosis not present

## 2018-08-14 DIAGNOSIS — L821 Other seborrheic keratosis: Secondary | ICD-10-CM | POA: Diagnosis not present

## 2018-08-16 DIAGNOSIS — Z23 Encounter for immunization: Secondary | ICD-10-CM | POA: Diagnosis not present

## 2018-08-23 DIAGNOSIS — J431 Panlobular emphysema: Secondary | ICD-10-CM | POA: Diagnosis not present

## 2018-08-23 DIAGNOSIS — J449 Chronic obstructive pulmonary disease, unspecified: Secondary | ICD-10-CM | POA: Diagnosis not present

## 2018-09-22 DIAGNOSIS — J431 Panlobular emphysema: Secondary | ICD-10-CM | POA: Diagnosis not present

## 2018-09-22 DIAGNOSIS — J449 Chronic obstructive pulmonary disease, unspecified: Secondary | ICD-10-CM | POA: Diagnosis not present

## 2018-09-23 DIAGNOSIS — H43813 Vitreous degeneration, bilateral: Secondary | ICD-10-CM | POA: Diagnosis not present

## 2018-09-23 DIAGNOSIS — C6931 Malignant neoplasm of right choroid: Secondary | ICD-10-CM | POA: Diagnosis not present

## 2018-10-01 DIAGNOSIS — C6931 Malignant neoplasm of right choroid: Secondary | ICD-10-CM | POA: Diagnosis not present

## 2018-10-04 DIAGNOSIS — C6931 Malignant neoplasm of right choroid: Secondary | ICD-10-CM | POA: Diagnosis not present

## 2018-10-04 DIAGNOSIS — C439 Malignant melanoma of skin, unspecified: Secondary | ICD-10-CM | POA: Diagnosis not present

## 2018-10-04 DIAGNOSIS — R918 Other nonspecific abnormal finding of lung field: Secondary | ICD-10-CM | POA: Diagnosis not present

## 2018-10-10 DIAGNOSIS — H2512 Age-related nuclear cataract, left eye: Secondary | ICD-10-CM | POA: Diagnosis not present

## 2018-10-10 DIAGNOSIS — H40023 Open angle with borderline findings, high risk, bilateral: Secondary | ICD-10-CM | POA: Diagnosis not present

## 2018-10-23 DIAGNOSIS — J449 Chronic obstructive pulmonary disease, unspecified: Secondary | ICD-10-CM | POA: Diagnosis not present

## 2018-10-23 DIAGNOSIS — J431 Panlobular emphysema: Secondary | ICD-10-CM | POA: Diagnosis not present

## 2018-10-30 DIAGNOSIS — C7949 Secondary malignant neoplasm of other parts of nervous system: Secondary | ICD-10-CM | POA: Diagnosis not present

## 2018-10-30 DIAGNOSIS — C801 Malignant (primary) neoplasm, unspecified: Secondary | ICD-10-CM | POA: Diagnosis not present

## 2018-10-30 DIAGNOSIS — C6931 Malignant neoplasm of right choroid: Secondary | ICD-10-CM | POA: Diagnosis not present

## 2018-10-30 DIAGNOSIS — D487 Neoplasm of uncertain behavior of other specified sites: Secondary | ICD-10-CM | POA: Diagnosis not present

## 2018-11-08 DIAGNOSIS — C6990 Malignant neoplasm of unspecified site of unspecified eye: Secondary | ICD-10-CM | POA: Diagnosis not present

## 2018-11-08 DIAGNOSIS — C7949 Secondary malignant neoplasm of other parts of nervous system: Secondary | ICD-10-CM | POA: Diagnosis not present

## 2018-11-08 DIAGNOSIS — Z79899 Other long term (current) drug therapy: Secondary | ICD-10-CM | POA: Diagnosis not present

## 2018-11-15 ENCOUNTER — Emergency Department (HOSPITAL_COMMUNITY): Payer: PPO

## 2018-11-15 ENCOUNTER — Encounter (HOSPITAL_COMMUNITY): Payer: Self-pay | Admitting: *Deleted

## 2018-11-15 ENCOUNTER — Other Ambulatory Visit: Payer: Self-pay

## 2018-11-15 ENCOUNTER — Observation Stay (HOSPITAL_COMMUNITY)
Admission: EM | Admit: 2018-11-15 | Discharge: 2018-11-16 | Disposition: A | Discharge status: Home / Self Care | Payer: PPO | Attending: Internal Medicine | Admitting: Internal Medicine

## 2018-11-15 DIAGNOSIS — Z85840 Personal history of malignant neoplasm of eye: Secondary | ICD-10-CM | POA: Diagnosis not present

## 2018-11-15 DIAGNOSIS — J441 Chronic obstructive pulmonary disease with (acute) exacerbation: Secondary | ICD-10-CM | POA: Diagnosis not present

## 2018-11-15 DIAGNOSIS — R0602 Shortness of breath: Secondary | ICD-10-CM | POA: Diagnosis not present

## 2018-11-15 DIAGNOSIS — R0902 Hypoxemia: Secondary | ICD-10-CM | POA: Diagnosis not present

## 2018-11-15 DIAGNOSIS — Z87891 Personal history of nicotine dependence: Secondary | ICD-10-CM | POA: Diagnosis not present

## 2018-11-15 DIAGNOSIS — I10 Essential (primary) hypertension: Secondary | ICD-10-CM | POA: Diagnosis not present

## 2018-11-15 DIAGNOSIS — C3490 Malignant neoplasm of unspecified part of unspecified bronchus or lung: Secondary | ICD-10-CM | POA: Diagnosis not present

## 2018-11-15 DIAGNOSIS — F419 Anxiety disorder, unspecified: Secondary | ICD-10-CM | POA: Diagnosis not present

## 2018-11-15 DIAGNOSIS — Z9889 Other specified postprocedural states: Secondary | ICD-10-CM | POA: Diagnosis not present

## 2018-11-15 DIAGNOSIS — C3401 Malignant neoplasm of right main bronchus: Secondary | ICD-10-CM | POA: Diagnosis not present

## 2018-11-15 DIAGNOSIS — R000 Tachycardia, unspecified: Secondary | ICD-10-CM | POA: Diagnosis not present

## 2018-11-15 DIAGNOSIS — C771 Secondary and unspecified malignant neoplasm of intrathoracic lymph nodes: Secondary | ICD-10-CM | POA: Diagnosis not present

## 2018-11-15 DIAGNOSIS — Z79899 Other long term (current) drug therapy: Secondary | ICD-10-CM | POA: Diagnosis not present

## 2018-11-15 DIAGNOSIS — C7800 Secondary malignant neoplasm of unspecified lung: Secondary | ICD-10-CM | POA: Diagnosis not present

## 2018-11-15 DIAGNOSIS — J449 Chronic obstructive pulmonary disease, unspecified: Secondary | ICD-10-CM | POA: Diagnosis not present

## 2018-11-15 DIAGNOSIS — R590 Localized enlarged lymph nodes: Secondary | ICD-10-CM | POA: Diagnosis not present

## 2018-11-15 DIAGNOSIS — R0689 Other abnormalities of breathing: Secondary | ICD-10-CM | POA: Diagnosis not present

## 2018-11-15 DIAGNOSIS — H5461 Unqualified visual loss, right eye, normal vision left eye: Secondary | ICD-10-CM | POA: Diagnosis not present

## 2018-11-15 DIAGNOSIS — R0600 Dyspnea, unspecified: Principal | ICD-10-CM | POA: Diagnosis present

## 2018-11-15 LAB — CBC WITH DIFFERENTIAL/PLATELET
Abs Immature Granulocytes: 0.05 10*3/uL (ref 0.00–0.07)
BASOS PCT: 1 %
Basophils Absolute: 0.1 10*3/uL (ref 0.0–0.1)
EOS ABS: 0.1 10*3/uL (ref 0.0–0.5)
Eosinophils Relative: 1 %
HEMATOCRIT: 47 % (ref 39.0–52.0)
Hemoglobin: 15 g/dL (ref 13.0–17.0)
Immature Granulocytes: 1 %
Lymphocytes Relative: 15 %
Lymphs Abs: 1.4 10*3/uL (ref 0.7–4.0)
MCH: 30.9 pg (ref 26.0–34.0)
MCHC: 31.9 g/dL (ref 30.0–36.0)
MCV: 96.9 fL (ref 80.0–100.0)
MONO ABS: 0.6 10*3/uL (ref 0.1–1.0)
Monocytes Relative: 7 %
Neutro Abs: 7.1 10*3/uL (ref 1.7–7.7)
Neutrophils Relative %: 75 %
PLATELETS: 274 10*3/uL (ref 150–400)
RBC: 4.85 MIL/uL (ref 4.22–5.81)
RDW: 13.1 % (ref 11.5–15.5)
WBC: 9.3 10*3/uL (ref 4.0–10.5)
nRBC: 0 % (ref 0.0–0.2)

## 2018-11-15 LAB — COMPREHENSIVE METABOLIC PANEL
ALT: 32 U/L (ref 0–44)
AST: 41 U/L (ref 15–41)
Albumin: 3.9 g/dL (ref 3.5–5.0)
Alkaline Phosphatase: 66 U/L (ref 38–126)
Anion gap: 11 (ref 5–15)
BILIRUBIN TOTAL: 1 mg/dL (ref 0.3–1.2)
BUN: 14 mg/dL (ref 8–23)
CO2: 26 mmol/L (ref 22–32)
CREATININE: 0.91 mg/dL (ref 0.61–1.24)
Calcium: 9 mg/dL (ref 8.9–10.3)
Chloride: 99 mmol/L (ref 98–111)
Glucose, Bld: 137 mg/dL — ABNORMAL HIGH (ref 70–99)
Potassium: 4.8 mmol/L (ref 3.5–5.1)
Sodium: 136 mmol/L (ref 135–145)
TOTAL PROTEIN: 6.9 g/dL (ref 6.5–8.1)

## 2018-11-15 LAB — I-STAT CHEM 8, ED
BUN: 17 mg/dL (ref 8–23)
CALCIUM ION: 1.06 mmol/L — AB (ref 1.15–1.40)
Chloride: 101 mmol/L (ref 98–111)
Creatinine, Ser: 0.8 mg/dL (ref 0.61–1.24)
GLUCOSE: 131 mg/dL — AB (ref 70–99)
HEMATOCRIT: 47 % (ref 39.0–52.0)
Hemoglobin: 16 g/dL (ref 13.0–17.0)
Potassium: 4.7 mmol/L (ref 3.5–5.1)
Sodium: 133 mmol/L — ABNORMAL LOW (ref 135–145)
TCO2: 27 mmol/L (ref 22–32)

## 2018-11-15 LAB — I-STAT TROPONIN, ED: Troponin i, poc: 0.01 ng/mL (ref 0.00–0.08)

## 2018-11-15 MED ORDER — MOMETASONE FURO-FORMOTEROL FUM 200-5 MCG/ACT IN AERO
2.0000 | INHALATION_SPRAY | Freq: Two times a day (BID) | RESPIRATORY_TRACT | Status: DC
  Administered 2018-11-15 – 2018-11-16 (×2): 2 via RESPIRATORY_TRACT
  Filled 2018-11-15: qty 8.8

## 2018-11-15 MED ORDER — SODIUM CHLORIDE 0.9 % IV BOLUS
1000.0000 mL | Freq: Once | INTRAVENOUS | Status: AC
  Administered 2018-11-15: 1000 mL via INTRAVENOUS

## 2018-11-15 MED ORDER — ENOXAPARIN SODIUM 40 MG/0.4ML ~~LOC~~ SOLN
40.0000 mg | Freq: Every day | SUBCUTANEOUS | Status: DC
  Administered 2018-11-16: 40 mg via SUBCUTANEOUS
  Filled 2018-11-15 (×2): qty 0.4

## 2018-11-15 MED ORDER — LORAZEPAM 0.5 MG PO TABS
0.5000 mg | ORAL_TABLET | Freq: Three times a day (TID) | ORAL | Status: DC | PRN
  Administered 2018-11-15: 0.5 mg via ORAL
  Filled 2018-11-15: qty 1

## 2018-11-15 MED ORDER — ACETAMINOPHEN 650 MG RE SUPP: 650.0000 mg | Freq: Four times a day (QID) | RECTAL | Status: DC | PRN

## 2018-11-15 MED ORDER — IOPAMIDOL (ISOVUE-370) INJECTION 76%
100.0000 mL | Freq: Once | INTRAVENOUS | Status: AC | PRN
  Administered 2018-11-15: 100 mL via INTRAVENOUS

## 2018-11-15 MED ORDER — PREDNISONE 20 MG PO TABS
40.0000 mg | ORAL_TABLET | Freq: Every day | ORAL | Status: DC
  Administered 2018-11-16: 40 mg via ORAL
  Filled 2018-11-15: qty 2

## 2018-11-15 MED ORDER — IOPAMIDOL (ISOVUE-370) INJECTION 76%
INTRAVENOUS | Status: AC
  Filled 2018-11-15: qty 100

## 2018-11-15 MED ORDER — ACETAMINOPHEN 325 MG PO TABS
650.0000 mg | ORAL_TABLET | Freq: Four times a day (QID) | ORAL | Status: DC | PRN
  Administered 2018-11-15: 650 mg via ORAL
  Filled 2018-11-15: qty 2

## 2018-11-15 MED ORDER — SODIUM CHLORIDE 0.9% FLUSH
3.0000 mL | Freq: Two times a day (BID) | INTRAVENOUS | Status: DC
  Administered 2018-11-16 (×2): 3 mL via INTRAVENOUS

## 2018-11-15 MED ORDER — IPRATROPIUM-ALBUTEROL 0.5-2.5 (3) MG/3ML IN SOLN: 3.0000 mL | Freq: Four times a day (QID) | RESPIRATORY_TRACT | Status: DC | PRN

## 2018-11-15 NOTE — ED Provider Notes (Signed)
Patient signed out to me from Dr. Darl Householder.  67 year old male with COPD and a lung mass being worked up at [facility] had a bronchoscopy and a mediastinal biopsy done earlier today.  He lives more locally and a few hours prior to arrival he was acutely short of breath.  He was evaluated by EMS and put on BiPAP and given albuterol Atrovent magnesium and Solu-Medrol.  He is signed out to me from Dr. Darl Householder to follow-up on a CT PE protocol and admit the patient for COPD.  CT is negative for pneumothorax or PE.  When I evaluated the patient he was still mildly short of breath but speaking in full sentences and on nasal cannula with sats of 90 to 93%.  He is agreeable to admission and have paged the unassigned team for evaluation.  Clinical Course as of Nov 16 2331  Fri Nov 15, 2018  1830 Discussed with 1 of the physicians from the internal medicine teaching service who will evaluate the patient in the ED for admission.   [MB]    Clinical Course User Index [MB] Hayden Rasmussen, MD      Hayden Rasmussen, MD 11/15/18 [phone]

## 2018-11-15 NOTE — H&P (Signed)
Date: 11/16/2018               Patient Name:  Christopher Mora MRN: [id number]  DOB: [birth year] Age / Sex: 67 y.o., male   PCP: [name], MD         Medical Service: Internal Medicine Teaching Service         Attending Physician: Dr. Daryll Drown    First Contact: Dr. Eileen Stanford Pager: [phone]  Second Contact: Dr. Shan Levans Pager: [phone]       After Hours (After 5p/  First Contact Pager: [phone]  weekends / holidays): Second Contact Pager: [phone]   Chief Complaint: Shortness of breath  History of Present Illness: Christopher Mora is a 67 year old pleasant gentleman with COPD, possible ocular melanoma which is being worked up by ophthalmology at [facility].  He was noted to have a large mediastinal and right hilar lymph nodes which was concerning for metastatic disease and was scheduled for bronchoscopy.  He subsequently underwent procedure this morning at [facility] however after the procedure he began experiencing shortness of breath.  When came home, he experienced worsening of dyspnea. He had difficulty breathing. Denies any chest pain or nausea or vomiting. He used the Duoneb but it did not help. Per wife, he looked like to have a panic attack.They called 911. EMS evaluated patient and put him on CPAP (per EMS note), and he was given Atrovent, magnesium and Solu-Medrol.  Per EMS note, they performed EKG and found st depressions and transferred the patient to hospital. (Unchanged from old EKG). On arrival to the emergency department, CPAP was discontinued and patient was transitioned to nasal cannula and maintain oxygen saturation above 92%.  He was also noted to be tachycardic however this resolved spontaneously.  Chest x-ray and CT angiography with no evidence of pneumothorax or pulmonary embolism.    Meds:  Current Meds  Medication Sig  . Fluticasone-Salmeterol (ADVAIR DISKUS) 250-50 MCG/DOSE AEPB Inhale 1 puff into the lungs 2 (two) times daily.  Marland Kitchen ipratropium-albuterol  (DUONEB) 0.5-2.5 (3) MG/3ML SOLN Inhale 3 mLs into the lungs 4 (four) times daily as needed for shortness of breath.  Marland Kitchen LORazepam (ATIVAN) 0.5 MG tablet Take 0.5 mg by mouth 3 (three) times daily as needed for anxiety.  . Melatonin 10 MG TABS Take 10 mg by mouth at bedtime.  Marland Kitchen PROAIR HFA 108 (90 Base) MCG/ACT inhaler Inhale 2 puffs into the lungs every 6 (six) hours as needed for wheezing or shortness of breath.     Allergies: Allergies as of 11/15/2018  . (No Known Allergies)   Past Medical History:  Diagnosis Date  . Asthma   . Cough "for a while"   nonproductive  . Pneumonia dec 2012  . Shortness of breath    occasional    Family History: Alzheimer's disease in father, breast cancer in mother, DM in brother Social History: Former heavy smoker (Quit less than 10 years ago), smokes marijuana some times, no alcohol use  Review of Systems: A complete ROS was negative except as per HPI.   Physical Exam: Blood pressure (!) 145/79, pulse 96, temperature 98.4 F (36.9 C), temperature source Oral, resp. rate (!) 22, height 5\' 8"  (1.727 m), weight 59.5 kg, SpO2 96 %. Physical Exam Constitutional:      General: He is not in acute distress.    Appearance: He is well-developed. He is not ill-appearing.   Eyes: Rt eys is patched  Cardiovascular:     Rate and Rhythm:  Normal rate and regular rhythm.     Heart sounds: No murmur.  Pulmonary:     Effort: Pulmonary effort is normal.     Breath sounds: Decreased breath sounds present. No wheezing or rales.  Abdominal:     General: Bowel sounds are normal.     Palpations: Abdomen is soft.     Tenderness: There is no abdominal tenderness.  Musculoskeletal:     Right lower leg: He exhibits no tenderness. No edema.     Left lower leg: He exhibits no tenderness. No edema.  Skin:    Capillary Refill: Capillary refill takes less than 2 seconds.     Coloration: Skin is not cyanotic.     Nails: There is no clubbing.   Neurological:      General: No focal deficit present.     Mental Status: He is alert and oriented to person, place, and time.  Psychiatric:        Mood and Affect: Mood normal.        Behavior: Behavior normal.   EKG: personally reviewed my interpretation is right axis deviation, PVC and old ST dep at inf leads, R atrial enlargement  CXR: personally reviewed my interpretation is: - ill-defined opacities at right upper lobe reported secondary to infiltrate for possible mass given its slightly stellate configuration.   -And right lower lobe opacity could be pneumonia. -No pneumothorax.  -Hyperinflation and emphysematous disease.  CT angiogram chest: No evidence of PE, no pneumothorax or complication of lung biopsy.  Small amount of fluid along the right fissure.  Mediastinal lymphadenopathy.  Assessment & Plan by Problem: Active Problems:   Acute dyspnea   1-Acute shortness of breath: Resolved. Patient presented with sudden onset shortness of breath. It started after he underwent bronchoscopy and biopsy today  for suspicious mediastinal lymph node in the hospital today.  Initial DDxs: *Broncoscopy/lung biopsy Complications: Chest x-ray and CT angiogram ruled out pneumothorax or complication of bronchoscopy and lung biopsy except a small amount of fluid along the right fissure.   *PE: Great risk for PE on Geneva score, CT angiogram performed at ED, did not show any evidence of PE.  *COPD exacerbation: No pulmonary function test in chart. On Advair twice daily, and DuoNeb 4 times daily as needed and pro-air every 6 hours as needed at home. Patient denies any fever, cough, sputum production.  His symptoms improved with CPAP and by arrival to the hospital. No SOB currently. And in no distress. Saturating well on 2li nasal O2. No wheezing on exam. Less concerning for COPD exacerbation. But will continue work up and treatment. No fever and no leukocytosis.  -Cardiac monitor -DuoNeb nebulizer every 6  hours PRN -Dulera 2 puffs twice daily -Prednisone 40 mg daily x4 days -Respiratory panel PCR -PT evaluation  *No evidence of ACS: No acute ST-T changes and nl Trop. *Anxiety/panic attack: Can be associated with this transient episode of shortness of breath. He is currently stable and asymptomatic.   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 Patient follows up at [facility]  Mediastinal lymph node biopsy result pending.    Diet: Heart healthy IV fluid:none VTE ppx: Lovenox Code status: Full   Dispo: Admit patient to Observation with expected length of stay less than 2 midnights.  SignedDewayne Hatch, MD 11/16/2018, 1:50 AM  Pager: [phone] IMTS PGY-1

## 2018-11-15 NOTE — ED Notes (Signed)
Attempted to call report to [facility]W. Call back number left.

## 2018-11-15 NOTE — ED Triage Notes (Signed)
Patient presents to ed via [place] , states he had a lung bx. This am at [facility], upon ems arrival patient was resp distress, was gvien Neb with 10 mg albuterol , atrovent 1 mg , Mag 2 mg IV , Solu-Medrol 125 mg , up arrival Patient is alert oriented c/o right sided chest pain .

## 2018-11-15 NOTE — ED Provider Notes (Addendum)
[place] EMERGENCY DEPARTMENT Provider Note   CSN: [id number] Arrival date & time: 11/15/18  1446     History   Chief Complaint Chief Complaint  Patient presents with  . Shortness of Breath    HPI Christopher Mora is a 67 y.o. male history of COPD, lung mass here presenting with shortness of breath.  Patient just had bronchoscopy with mediastinal mass biopsy done at [facility] earlier today.  Patient was doing well and several hours later, patient had sudden onset of shortness of breath.  Patient was thought to be wheezing and apparently his oxygen was 50% so he was put on BiPAP.  Patient was also given 10 mg of albuterol, 1 mg of Atrovent, 2 mg of magnesium, and 125 mg of solumedrol.  Patient states that he is not on oxygen at baseline.  The history is provided by the patient.    Past Medical History:  Diagnosis Date  . Asthma   . Cough "for a while"   nonproductive  . Pneumonia dec 2012  . Shortness of breath    occasional    Patient Active Problem List   Diagnosis Date Noted  . Acute dyspnea 11/15/2018  . Perforated diverticulitis 05/02/2012    Past Surgical History:  Procedure Laterality Date  . APPENDECTOMY  04/15/2012   Procedure: APPENDECTOMY;  Surgeon: Edward Jolly, MD;  Location: [facility];  Service: General;;  . CATARACT EXTRACTION    . COLON SURGERY    . COLOSTOMY  04/15/2012   Procedure: COLOSTOMY;  Surgeon: Edward Jolly, MD;  Location: [facility];  Service: General;;  . COLOSTOMY TAKEDOWN  12/23/2012   Procedure: COLOSTOMY TAKEDOWN;  Surgeon: Edward Jolly, MD;  Location: [facility];  Service: General;  Laterality: N/A;  TAKEDOWN OF HARTMAN COLOSTOMY   . INTRAOCULAR LENS IMPLANT, SECONDARY    . KNEE SURGERY  1973   Left  . KNEE SURGERY    . LAPAROTOMY  04/15/2012   Procedure: EXPLORATORY LAPAROTOMY;  Surgeon: Edward Jolly, MD;  Location: [facility];  Service: General;  Laterality: N/A;  . PARTIAL COLECTOMY  04/15/2012   Procedure: PARTIAL COLECTOMY;  Surgeon: Edward Jolly, MD;  Location: [facility];  Service: General;;  sigmoid colectomy  . SHOULDER SURGERY  2009  . SHOULDER SURGERY          Home Medications    Prior to Admission medications   Medication Sig Start Date End Date Taking? Authorizing Provider  Fluticasone-Salmeterol (ADVAIR DISKUS) 250-50 MCG/DOSE AEPB Inhale 1 puff into the lungs 2 (two) times daily. 02/05/17  Yes [provider]  ipratropium-albuterol (DUONEB) 0.5-2.5 (3) MG/3ML SOLN Inhale 3 mLs into the lungs 4 (four) times daily as needed for shortness of breath. 06/30/18  Yes [provider]  LORazepam (ATIVAN) 0.5 MG tablet Take 0.5 mg by mouth 3 (three) times daily as needed for anxiety. 11/16/17  Yes [provider]  Melatonin 10 MG TABS Take 10 mg by mouth at bedtime.   Yes [provider]  PROAIR HFA 108 (90 Base) MCG/ACT inhaler Inhale 2 puffs into the lungs every 6 (six) hours as needed for wheezing or shortness of breath. 01/01/18  Yes [provider]  predniSONE (DELTASONE) 20 MG tablet Take 2 tablets (40 mg total) by mouth daily with breakfast for 4 days. 11/17/18 11/21/18  Jean Rosenthal, MD    Family History Family History  Problem Relation Age of Onset  . Alzheimer's disease Father  Social History Social History   Tobacco Use  . Smoking status: Current Every Day Smoker    Packs/day: 5.00    Years: 40.00    Pack years: 200.00    Types: Cigars  . Smokeless tobacco: Never Used  Substance Use Topics  . Alcohol use: No  . Drug use: Yes    Types: Marijuana    Comment: occasional marijuana     Allergies   Patient has no known allergies.   Review of Systems Review of Systems  Respiratory: Positive for shortness of breath.   All other systems reviewed and are negative.    Physical Exam Updated Vital Signs BP 129/76   Pulse 84   Temp 97.8 F (36.6 C) (Oral)   Resp 14   Ht 5\' 8"  (1.727 m)   Wt 59.5 kg    SpO2 97%   BMI 19.94 kg/m   Physical Exam Vitals signs reviewed.  Constitutional:      Comments: tachypneic   HENT:     Head: Normocephalic.     Mouth/Throat:     Mouth: Mucous membranes are moist.  Eyes:     Extraocular Movements: Extraocular movements intact.     Pupils: Pupils are equal, round, and reactive to light.  Neck:     Musculoskeletal: Normal range of motion.  Cardiovascular:     Rate and Rhythm: Regular rhythm.     Comments: Tachycardic  Pulmonary:     Comments: Tachypneic, diminished breath sounds R side, minimal wheezing  Abdominal:     General: Bowel sounds are normal.     Palpations: Abdomen is soft.  Musculoskeletal: Normal range of motion.  Skin:    General: Skin is warm.     Capillary Refill: Capillary refill takes less than 2 seconds.  Neurological:     General: No focal deficit present.  Psychiatric:        Mood and Affect: Mood normal.        Behavior: Behavior normal.      ED Treatments / Results  Labs (all labs ordered are listed, but only abnormal results are displayed) Labs Reviewed  COMPREHENSIVE METABOLIC PANEL - Abnormal; Notable for the following components:      Result Value   Glucose, Bld 137 (*)    All other components within normal limits  I-STAT CHEM 8, ED - Abnormal; Notable for the following components:   Sodium 133 (*)    Glucose, Bld 131 (*)    Calcium, Ion 1.06 (*)    All other components within normal limits  RESPIRATORY PANEL BY PCR  CBC WITH DIFFERENTIAL/PLATELET  I-STAT TROPONIN, ED    EKG EKG Interpretation  Date/Time:  Friday November 15 2018 14:47:33 EST Ventricular Rate:  115 PR Interval:    QRS Duration: 89 QT Interval:  317 QTC Calculation: 439 R Axis:   91 Text Interpretation:  Sinus tachycardia Paired ventricular premature complexes Aberrant conduction of SV complex(es) Consider right atrial enlargement Right axis deviation ST depr, consider ischemia, inferior leads No significant change since last  tracing Confirmed by Wandra Arthurs [phone]) on 11/15/2018 3:25:55 PM   Radiology No results found.  Procedures Procedures (including critical care time)   CRITICAL CARE Performed by: Wandra Arthurs   Total critical care time: 30 minutes  Critical care time was exclusive of separately billable procedures and treating other patients.  Critical care was necessary to treat or prevent imminent or life-threatening deterioration.  Critical care was time spent personally by me on  the following activities: development of treatment plan with patient and/or surrogate as well as nursing, discussions with consultants, evaluation of patient's response to treatment, examination of patient, obtaining history from patient or surrogate, ordering and performing treatments and interventions, ordering and review of laboratory studies, ordering and review of radiographic studies, pulse oximetry and re-evaluation of patient's condition.  EMERGENCY DEPARTMENT [place] LUNG EXAM "Study: Limited Ultrasound of the Lung and Thorax"  INDICATIONS: Dyspnea Multiple views of both lungs using sagittal orientation were obtained.  PERFORMED BY: Myself IMAGES ARCHIVED?: Yes LIMITATIONS: Respiratory distress VIEWS USED: Anterior lung fields INTERPRETATION: Possible small R apical pneumothorax        Medications Ordered in ED Medications  sodium chloride 0.9 % bolus 1,000 mL (0 mLs Intravenous Stopped 11/15/18 2116)  iopamidol (ISOVUE-370) 76 % injection 100 mL (100 mLs Intravenous Contrast Given 11/15/18 1718)     Initial Impression / Assessment and Plan / ED Course  I have reviewed the triage vital signs and the nursing notes.  Pertinent labs & imaging results that were available during my care of the patient were reviewed by me and considered in my medical decision making (see chart for details).  Clinical Course as of Nov 19 654  Fri Nov 15, 2018  1830 Discussed with 1 of the physicians from the internal  medicine teaching service who will evaluate the patient in the ED for admission.   [MB]    Clinical Course User Index [MB] Hayden Rasmussen, MD   Virgal Warmuth is a 67 y.o. male here with SOB after bronchoscopy with lung biopsy. Concern for possible pneumothorax. I removed the CPAP. Also consider ruptured bleb vs COPD exacerbation. Bedside US showed possible small R apical pneumothorax. Patient's O2 is 90% on 2 L [place] and this is a new oxygen requirement. Will get labs, CXR. If CXR showed no pneumothorax, will get CTA chest.   3:30 pm CXR showed no pneumothorax. Labs unremarkable. CTA pending. Offered admission at [facility] vs transfer to [facility] and patient prefers to stay here unless he absolutely has to go to [facility]. Signed out to Dr. Melina Copa to follow up CTA and admit patient.    Final Clinical Impressions(s) / ED Diagnoses   Final diagnoses:  COPD exacerbation [facility])    ED Discharge Orders         Ordered    predniSONE (DELTASONE) 20 MG tablet  Daily with breakfast     11/16/18 1126    Increase activity slowly     11/16/18 1126    Diet - low sodium heart healthy     11/16/18 1126    Discharge instructions    Comments:  Mr. Callaham,  It was a pleasure taking care of you at the hospital.  You were admitted because of trouble breathing.  Work-up for heart attack, clot in your lungs were all negative.  Your shortness of breath could have been due to worsening of the COPD or panic attack.  He will be discharged with close oxygen to use when he walk at least until you follow-up with your primary doctor.  Also have to take prednisone for the next 4 days.  ~Take care and happy holidays Dr. Eileen Stanford   11/16/18 1126    Call MD for:  difficulty breathing, headache or visual disturbances     11/16/18 1126           Drenda Freeze, MD 11/15/18 1542    Drenda Freeze, MD 11/19/18 [phone]

## 2018-11-16 ENCOUNTER — Other Ambulatory Visit: Payer: Self-pay

## 2018-11-16 DIAGNOSIS — Z87891 Personal history of nicotine dependence: Secondary | ICD-10-CM

## 2018-11-16 DIAGNOSIS — J449 Chronic obstructive pulmonary disease, unspecified: Secondary | ICD-10-CM

## 2018-11-16 DIAGNOSIS — Z79899 Other long term (current) drug therapy: Secondary | ICD-10-CM | POA: Diagnosis not present

## 2018-11-16 DIAGNOSIS — R0600 Dyspnea, unspecified: Secondary | ICD-10-CM | POA: Diagnosis not present

## 2018-11-16 DIAGNOSIS — J984 Other disorders of lung: Secondary | ICD-10-CM

## 2018-11-16 DIAGNOSIS — F419 Anxiety disorder, unspecified: Secondary | ICD-10-CM

## 2018-11-16 LAB — RESPIRATORY PANEL BY PCR
Adenovirus: NOT DETECTED
Bordetella pertussis: NOT DETECTED
CORONAVIRUS OC43-RVPPCR: NOT DETECTED
Chlamydophila pneumoniae: NOT DETECTED
Coronavirus 229E: NOT DETECTED
Coronavirus HKU1: NOT DETECTED
Coronavirus NL63: NOT DETECTED
Influenza A: NOT DETECTED
Influenza B: NOT DETECTED
Metapneumovirus: NOT DETECTED
Mycoplasma pneumoniae: NOT DETECTED
PARAINFLUENZA VIRUS 4-RVPPCR: NOT DETECTED
Parainfluenza Virus 1: NOT DETECTED
Parainfluenza Virus 2: NOT DETECTED
Parainfluenza Virus 3: NOT DETECTED
Respiratory Syncytial Virus: NOT DETECTED
Rhinovirus / Enterovirus: NOT DETECTED

## 2018-11-16 MED ORDER — PREDNISONE 20 MG PO TABS: 40.0000 mg | ORAL_TABLET | Freq: Every day | ORAL | 0 refills | Status: AC

## 2018-11-16 NOTE — Progress Notes (Addendum)
   Subjective: HD #1  Overnight: No acute events reported  Today, Christopher Mora reports he is back to his baseline and denies shortness of breath, chest pain.  He has not had breakfast yet but endorses a good appetite.  Objective:  Vital signs in last 24 hours: Vitals:   11/15/18 2115 11/15/18 2130 11/15/18 2231 11/15/18 2235  BP: (!) 135/57 117/73  (!) 145/79  Pulse: 92 89  96  Resp: (!) 22 15  (!) 22  Temp:    98.4 F (36.9 C)  TempSrc:    Oral  SpO2: 95% 96%  96%  Weight:   59.5 kg   Height:   5\' 8"  (1.727 m)    Constitutional: In no acute distress, comfortably lying in bed, speaks in full sentences Cardiovascular: RRR, no murmurs, gallops, rubs Respiratory: Decreased inspiratory effort, prolonged expiratory phase.  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  Underwent bronchoscopy and presented with acute onset dyspnea.  Acute onset dyspnea: This has resolved.  Now saturating above 90% on 2 L nasal cannula.  Ambulated this a.m. and required supplemental oxygen after desaturated to the 80s.  This is most consistent with his advanced COPD as physical exams reviewed decrease respiratory effort and prolonged expiratory phase however no wheezes, crackles or rhonchi.  It is reassuring that work-up for pulmonary embolus, aortic dissection and myocardial infarction was negative.  It is possible that his acute onset dyspnea could have been related to a panic attack given his history of anxiety disorder. - Will require oxygen with ambulation - DuoNeb as needed, Dulera - Continue prednisone for 4 more days - Continue cardiac monitoring  Metastatic lung disease: Currently being worked up at [facility] for metastatic mediastinal lymphadenopathy.   -Follow-up with pulmonology and ophthalmology at [facility].  Anxiety disorder: Continue home Ativan.  Dispo: Anticipated discharge in approximately today with supplemental oxygen.  Jean Rosenthal, MD 11/16/2018, 6:23 AM Pager: [phone] IMTS PGY-1

## 2018-11-16 NOTE — Care Management Obs Status (Signed)
[place] NOTIFICATION   Patient Details  Name: Christopher Mora MRN: [id number] Date of Birth: 07-Jun-1951   Medicare Observation Status Notification Given:  Yes    Zenon Mayo, RN 11/16/2018, 12:10 PM

## 2018-11-16 NOTE — Progress Notes (Signed)
SATURATION QUALIFICATIONS: (This note is used to comply with regulatory documentation for home oxygen)  Patient Saturations on Room Air at Rest = 94%  Patient Saturations on Room Air while Ambulating = 82%  Patient Saturations on 2 Liters of oxygen while Ambulating = 93%  Please briefly explain why patient needs home oxygen:  Patient did okay for about 10 steps. Then O2 sats dropped to 82% and patient felt and appeared very dyspneic. Patient placed on 2L and walked another 20 feet and recovered to 93% in approx 3 mintures. Resting in bed comfortably now.    Brendia Sacks RN [phone] 11/16/18

## 2018-11-16 NOTE — Discharge Summary (Signed)
Name: Christopher Mora MRN: [id number] DOB: 08-11-1951 67 y.o. PCP: Christopher Sacramento, MD  Date of Admission: 11/15/2018  2:46 PM Date of Discharge: 11/16/2018 Attending Physician: Sid Falcon, MD  Discharge Diagnosis: 1.  Acute onset dyspnea 2.  Metastatic lung disease 3.  Anxiety disorder  Discharge Medications: Allergies as of 11/16/2018   No Known Allergies     Medication List    TAKE these medications   ADVAIR DISKUS 250-50 MCG/DOSE Aepb Generic drug:  Fluticasone-Salmeterol Inhale 1 puff into the lungs 2 (two) times daily.   ipratropium-albuterol 0.5-2.5 (3) MG/3ML Soln Commonly known as:  DUONEB Inhale 3 mLs into the lungs 4 (four) times daily as needed for shortness of breath.   LORazepam 0.5 MG tablet Commonly known as:  ATIVAN Take 0.5 mg by mouth 3 (three) times daily as needed for anxiety.   Melatonin 10 MG Tabs Take 10 mg by mouth at bedtime.   predniSONE 20 MG tablet Commonly known as:  DELTASONE Take 2 tablets (40 mg total) by mouth daily with breakfast for 4 days. Start taking on:  November 17, 2018   [facility] HFA 108 (90 Base) MCG/ACT inhaler Generic drug:  albuterol Inhale 2 puffs into the lungs every 6 (six) hours as needed for wheezing or shortness of breath.            Durable Medical Equipment  (From admission, onward)         Start     Ordered   11/16/18 1204  For home use only DME oxygen  Once    Comments:  History of COPD and presented with acute shortness of breath which has resolved.  Desaturated to the 80s on ambulation.  Previously not requiring supplemental oxygen but will need O2 with ambulation.  Question Answer Comment  Mode or (Route) Nasal cannula   Liters per Minute 2   Frequency Continuous (stationary and portable oxygen unit needed)   Oxygen delivery system Gas      11/16/18 1203          Disposition and follow-up:   [name] was discharged from [facility] in [place] condition.  At the  hospital follow up visit please address:  1. Acute onset dyspnea: This could have been due to panic attack or worsening COPD.  We ruled out pulmonary embolism, pneumothorax, myocardial infarction and aortic dissection.  Was discharged with supplemental oxygen which he might require with ambulation going forward.  Follow-up with primary care physician.  2.  Labs / imaging needed at time of follow-up: None  3.  Pending labs/ test needing follow-up: None  Follow-up Appointments:   Hospital Course by problem list: 1.  Acute onset dyspnea: [name] is a 67 year old pleasant gentleman with COPD and ongoing work-up for mediastinal and hilar lymphadenopathy who presented with acute onset shortness of breath after undergoing EBUS at [facility].  He was initially placed on CPAP on arrival which was transitioned to nasal cannula.  He maintained oxygenation above 90% on 2L nasal cannula.  In addition, prednisone and inhalers for initiated for treatment of possible mild exacerbation of COPD.  Work-up for pneumothorax, pulmonary embolism, aortic dissection and acute myocardial infarction were all negative.  He had required supplemental oxygen on ambulation and was discharged with an oxygen tank through the help of case management.  2.  Metastatic lung disease: [name] is to continue follow-up with [facility] pulmonology.  3.  Anxiety disorder: [name] is to continue his  home Ativan.  Discharge Vitals:   BP 129/76   Pulse 84   Temp 97.8 F (36.6 C) (Oral)   Resp 14   Ht 5\' 8"  (1.727 m)   Wt 59.5 kg   SpO2 97%   BMI 19.94 kg/m   Pertinent Labs, Studies, and Procedures:  Chest x-ray IMPRESSION: 1. Hyperinflation and emphysematous disease.  No pneumothorax 2. Ill-defined opacities in the right upper lobe medially and adjacent to the pulmonary fissure. Right lower opacity could be secondary to pneumonia, medial right upper lung opacity could be secondary to infiltrate or  possible mass given its slightly stellate configuration. These findings are new since comparison radiograph 01/12/2018. Chest CT follow-up as indicated.  CT angiography chest IMPRESSION: 1. No evidence acute pulmonary embolism. 2. No pneumothorax or significant complication following reported lung biopsy same day. 3. Small amount of fluid along the RIGHT fissure may relate tobiopsy. 4. Mediastinal lymphadenopathy is concerning for malignancy versus reactive adenopathy. No comparison available. 5. Nodular densities at the RIGHT lung base and band of linear thickening. Recommend correlation with rationale for lung biopsy which was presumably driven by outside imaging.  Discharge Instructions: Discharge Instructions    Call MD for:  difficulty breathing, headache or visual disturbances   Complete by:  As directed    Diet - low sodium heart healthy   Complete by:  As directed    Discharge instructions   Complete by:  As directed    [name],  It was a pleasure taking care of you at the hospital.  You were admitted because of trouble breathing.  Work-up for heart attack, clot in your lungs were all negative.  Your shortness of breath could have been due to worsening of the COPD or panic attack.  He will be discharged with close oxygen to use when he walk at least until you follow-up with your primary doctor.  Also have to take prednisone for the next 4 days.  ~Take care and happy holidays Dr. Eileen Stanford   Increase activity slowly   Complete by:  As directed       Signed: Jean Rosenthal, MD 11/16/2018, 1:10 PM   Pager: [phone] IMTS PGY-1

## 2018-11-16 NOTE — Evaluation (Signed)
Physical Therapy Evaluation Patient Details Name: Demetrick Eichenberger MRN: [id number] DOB: August 05, 1951 Today's Date: 11/16/2018   History of Present Illness  Pt is a 67 y.o. 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 Following bronchoscopy at [facility] he began experiencing shortness of breath. Chest x-ray and CT angiography with no evidence of pneumothorax.   Clinical Impression  Patient evaluated by Physical Therapy with no further acute PT needs identified. Patient ambulating 50 ft with no assistive device and no evidence of imbalance. Desaturation to 89% on 2L O2 towards end of walking. Extensively educated on energy conservation and endurance training. Instructed pt on incentive spirometer use. All education has been completed and the patient has no further questions. See below for any follow-up Physical Therapy or equipment needs. PT is signing off. Thank you for this referral.      Follow Up Recommendations No PT follow up    Equipment Recommendations  3in1 (PT)    Recommendations for Other Services       Precautions / Restrictions Precautions Precautions: Other (comment) Precaution Comments: watch O2 Restrictions Weight Bearing Restrictions: No      Mobility  Bed Mobility Overal bed mobility: Independent                Transfers Overall transfer level: Independent Equipment used: None                Ambulation/Gait Ambulation/Gait assistance: Modified independent (Device/Increase time)(increased time) Gait Distance (Feet): 50 Feet Assistive device: None Gait Pattern/deviations: Step-through pattern;Decreased stride length     General Gait Details: No evidence of imbalance  Stairs            Wheelchair Mobility    Modified Rankin (Stroke Patients Only)       Balance Overall balance assessment: No apparent balance deficits (not  formally assessed)                                           Pertinent Vitals/Pain Pain Assessment: No/denies pain    Home Living Family/patient expects to be discharged to:: Private residence Living Arrangements: Spouse/significant other;Other (Comment)(wife and her son) Available Help at Discharge: Family Type of Home: House Home Access: Level entry     Home Layout: One level Home Equipment: None      Prior Function Level of Independence: Independent               Hand Dominance        Extremity/Trunk Assessment   Upper Extremity Assessment Upper Extremity Assessment: Overall WFL for tasks assessed    Lower Extremity Assessment Lower Extremity Assessment: Overall WFL for tasks assessed    Cervical / Trunk Assessment Cervical / Trunk Assessment: Normal  Communication   Communication: No difficulties  Cognition Arousal/Alertness: Awake/alert Behavior During Therapy: WFL for tasks assessed/performed Overall Cognitive Status: Within Functional Limits for tasks assessed                                        General Comments      Exercises     Assessment/Plan    PT Assessment Patent does not need any further PT services  PT Problem List Decreased activity tolerance;Decreased mobility;Cardiopulmonary  status limiting activity       PT Treatment Interventions      PT Goals (Current goals can be found in the Care Plan section)  Acute Rehab PT Goals Patient Stated Goal: "learn how to build my energy back up." PT Goal Formulation: All assessment and education complete, DC therapy    Frequency     Barriers to discharge        Co-evaluation               AM-PAC PT "6 Clicks" Mobility  Outcome Measure Help needed turning from your back to your side while in a flat bed without using bedrails?: None Help needed moving from lying on your back to sitting on the side of a flat bed without using bedrails?:  None Help needed moving to and from a bed to a chair (including a wheelchair)?: None Help needed standing up from a chair using your arms (e.g., wheelchair or bedside chair)?: None Help needed to walk in hospital room?: None Help needed climbing 3-5 steps with a railing? : A Little 6 Click Score: 23    End of Session Equipment Utilized During Treatment: Oxygen(2L) Activity Tolerance: Patient tolerated treatment well Patient left: in chair;with call bell/phone within reach;with family/visitor present Nurse Communication: Mobility status PT Visit Diagnosis: Difficulty in walking, not elsewhere classified (R26.2)    Time: 0071-2197 PT Time Calculation (min) (ACUTE ONLY): 19 min   Charges:   PT Evaluation $PT Eval Low Complexity: 1 Low        Ellamae Sia, PT, DPT Acute Rehabilitation Services Pager [phone] Office [phone]   Willy Eddy 11/16/2018, 10:03 AM

## 2018-11-16 NOTE — Care Management Note (Signed)
Case Management Note  Patient Details  Name: Christopher Mora MRN: [id number] Date of Birth: [birth year]  Subjective/Objective:     For dc today, he will need home oxygen, he would like [facility] to bring oxygen.  NCM contacted Jermaine with [facility] for oxygen.  Patient states he does not need 3 n 1.                 Action/Plan: DC home when oxygen delivered to room.   Expected Discharge Date:  11/16/18               Expected Discharge Plan:  Home/Self Care  In-House Referral:     Discharge planning Services  CM Consult  Post Acute Care Choice:  Durable Medical Equipment Choice offered to:  Patient  DME Arranged:  Oxygen DME Agency:  [place]:    [facility] Agency:     Status of Service:  Completed, signed off  If discussed at [place] of Stay Meetings, dates discussed:    Additional Comments:  Zenon Mayo, RN 11/16/2018, 12:13 PM

## 2018-11-22 DIAGNOSIS — J431 Panlobular emphysema: Secondary | ICD-10-CM | POA: Diagnosis not present

## 2018-11-22 DIAGNOSIS — J449 Chronic obstructive pulmonary disease, unspecified: Secondary | ICD-10-CM | POA: Diagnosis not present

## 2018-12-09 ENCOUNTER — Encounter (HOSPITAL_COMMUNITY): Payer: Self-pay | Admitting: Emergency Medicine

## 2018-12-09 ENCOUNTER — Emergency Department (HOSPITAL_COMMUNITY): Payer: Medicare HMO

## 2018-12-09 ENCOUNTER — Other Ambulatory Visit: Payer: Self-pay

## 2018-12-09 ENCOUNTER — Inpatient Hospital Stay (HOSPITAL_COMMUNITY)
Admission: EM | Admit: 2018-12-09 | Discharge: 2018-12-16 | DRG: 208 | Disposition: A | Discharge status: Hospice | Payer: Medicare HMO | Attending: Internal Medicine | Admitting: Internal Medicine

## 2018-12-09 DIAGNOSIS — Z7951 Long term (current) use of inhaled steroids: Secondary | ICD-10-CM

## 2018-12-09 DIAGNOSIS — J9622 Acute and chronic respiratory failure with hypercapnia: Secondary | ICD-10-CM | POA: Diagnosis not present

## 2018-12-09 DIAGNOSIS — C7940 Secondary malignant neoplasm of unspecified part of nervous system: Secondary | ICD-10-CM

## 2018-12-09 DIAGNOSIS — R062 Wheezing: Secondary | ICD-10-CM

## 2018-12-09 DIAGNOSIS — J189 Pneumonia, unspecified organism: Secondary | ICD-10-CM | POA: Diagnosis present

## 2018-12-09 DIAGNOSIS — G9340 Encephalopathy, unspecified: Secondary | ICD-10-CM | POA: Diagnosis not present

## 2018-12-09 DIAGNOSIS — I272 Pulmonary hypertension, unspecified: Secondary | ICD-10-CM | POA: Diagnosis present

## 2018-12-09 DIAGNOSIS — F17201 Nicotine dependence, unspecified, in remission: Secondary | ICD-10-CM

## 2018-12-09 DIAGNOSIS — J9601 Acute respiratory failure with hypoxia: Secondary | ICD-10-CM | POA: Insufficient documentation

## 2018-12-09 DIAGNOSIS — J690 Pneumonitis due to inhalation of food and vomit: Principal | ICD-10-CM | POA: Diagnosis present

## 2018-12-09 DIAGNOSIS — E43 Unspecified severe protein-calorie malnutrition: Secondary | ICD-10-CM

## 2018-12-09 DIAGNOSIS — C7949 Secondary malignant neoplasm of other parts of nervous system: Secondary | ICD-10-CM | POA: Diagnosis present

## 2018-12-09 DIAGNOSIS — J156 Pneumonia due to other aerobic Gram-negative bacteria: Secondary | ICD-10-CM | POA: Diagnosis present

## 2018-12-09 DIAGNOSIS — C3491 Malignant neoplasm of unspecified part of right bronchus or lung: Secondary | ICD-10-CM | POA: Diagnosis present

## 2018-12-09 DIAGNOSIS — C3490 Malignant neoplasm of unspecified part of unspecified bronchus or lung: Secondary | ICD-10-CM

## 2018-12-09 DIAGNOSIS — I469 Cardiac arrest, cause unspecified: Secondary | ICD-10-CM | POA: Diagnosis not present

## 2018-12-09 DIAGNOSIS — I313 Pericardial effusion (noninflammatory): Secondary | ICD-10-CM | POA: Diagnosis present

## 2018-12-09 DIAGNOSIS — Z9981 Dependence on supplemental oxygen: Secondary | ICD-10-CM

## 2018-12-09 DIAGNOSIS — F17290 Nicotine dependence, other tobacco product, uncomplicated: Secondary | ICD-10-CM | POA: Diagnosis present

## 2018-12-09 DIAGNOSIS — R0603 Acute respiratory distress: Secondary | ICD-10-CM

## 2018-12-09 DIAGNOSIS — D72829 Elevated white blood cell count, unspecified: Secondary | ICD-10-CM | POA: Diagnosis present

## 2018-12-09 DIAGNOSIS — Z4659 Encounter for fitting and adjustment of other gastrointestinal appliance and device: Secondary | ICD-10-CM

## 2018-12-09 DIAGNOSIS — J441 Chronic obstructive pulmonary disease with (acute) exacerbation: Secondary | ICD-10-CM | POA: Diagnosis present

## 2018-12-09 DIAGNOSIS — J181 Lobar pneumonia, unspecified organism: Secondary | ICD-10-CM

## 2018-12-09 DIAGNOSIS — Z681 Body mass index (BMI) 19 or less, adult: Secondary | ICD-10-CM

## 2018-12-09 DIAGNOSIS — Z820 Family history of epilepsy and other diseases of the nervous system: Secondary | ICD-10-CM

## 2018-12-09 DIAGNOSIS — R627 Adult failure to thrive: Secondary | ICD-10-CM | POA: Diagnosis present

## 2018-12-09 DIAGNOSIS — Z515 Encounter for palliative care: Secondary | ICD-10-CM | POA: Diagnosis not present

## 2018-12-09 DIAGNOSIS — Z66 Do not resuscitate: Secondary | ICD-10-CM | POA: Diagnosis not present

## 2018-12-09 DIAGNOSIS — R1314 Dysphagia, pharyngoesophageal phase: Secondary | ICD-10-CM | POA: Diagnosis present

## 2018-12-09 DIAGNOSIS — R1313 Dysphagia, pharyngeal phase: Secondary | ICD-10-CM | POA: Diagnosis present

## 2018-12-09 DIAGNOSIS — R64 Cachexia: Secondary | ICD-10-CM | POA: Diagnosis present

## 2018-12-09 DIAGNOSIS — Z885 Allergy status to narcotic agent status: Secondary | ICD-10-CM

## 2018-12-09 DIAGNOSIS — Z9849 Cataract extraction status, unspecified eye: Secondary | ICD-10-CM

## 2018-12-09 DIAGNOSIS — J9621 Acute and chronic respiratory failure with hypoxia: Secondary | ICD-10-CM | POA: Diagnosis not present

## 2018-12-09 HISTORY — DX: Malignant (primary) neoplasm, unspecified: C80.1

## 2018-12-09 LAB — CBC WITH DIFFERENTIAL/PLATELET
Abs Immature Granulocytes: 0.09 10*3/uL — ABNORMAL HIGH (ref 0.00–0.07)
Basophils Absolute: 0 10*3/uL (ref 0.0–0.1)
Basophils Relative: 0 %
Eosinophils Absolute: 0.1 10*3/uL (ref 0.0–0.5)
Eosinophils Relative: 1 %
HCT: 45.1 % (ref 39.0–52.0)
Hemoglobin: 14.2 g/dL (ref 13.0–17.0)
IMMATURE GRANULOCYTES: 1 %
Lymphocytes Relative: 8 %
Lymphs Abs: 1 10*3/uL (ref 0.7–4.0)
MCH: 30 pg (ref 26.0–34.0)
MCHC: 31.5 g/dL (ref 30.0–36.0)
MCV: 95.3 fL (ref 80.0–100.0)
Monocytes Absolute: 1.6 10*3/uL — ABNORMAL HIGH (ref 0.1–1.0)
Monocytes Relative: 13 %
NEUTROS PCT: 77 %
NRBC: 0 % (ref 0.0–0.2)
Neutro Abs: 10 10*3/uL — ABNORMAL HIGH (ref 1.7–7.7)
Platelets: 527 10*3/uL — ABNORMAL HIGH (ref 150–400)
RBC: 4.73 MIL/uL (ref 4.22–5.81)
RDW: 13 % (ref 11.5–15.5)
WBC: 12.8 10*3/uL — ABNORMAL HIGH (ref 4.0–10.5)

## 2018-12-09 LAB — COMPREHENSIVE METABOLIC PANEL
ALT: 43 U/L (ref 0–44)
AST: 37 U/L (ref 15–41)
Albumin: 2.7 g/dL — ABNORMAL LOW (ref 3.5–5.0)
Alkaline Phosphatase: 88 U/L (ref 38–126)
Anion gap: 13 (ref 5–15)
BUN: 26 mg/dL — ABNORMAL HIGH (ref 8–23)
CHLORIDE: 98 mmol/L (ref 98–111)
CO2: 27 mmol/L (ref 22–32)
Calcium: 9.4 mg/dL (ref 8.9–10.3)
Creatinine, Ser: 0.67 mg/dL (ref 0.61–1.24)
GFR calc Af Amer: >60 mL/min (ref >60)
GFR calc non Af Amer: >60 mL/min (ref >60)
Glucose, Bld: 109 mg/dL — ABNORMAL HIGH (ref 70–99)
POTASSIUM: 4.2 mmol/L (ref 3.5–5.1)
SODIUM: 138 mmol/L (ref 135–145)
Total Bilirubin: 0.9 mg/dL (ref 0.3–1.2)
Total Protein: 6.6 g/dL (ref 6.5–8.1)

## 2018-12-09 LAB — INFLUENZA PANEL BY PCR (TYPE A & B)
INFLBPCR: NEGATIVE
Influenza A By PCR: NEGATIVE

## 2018-12-09 MED ORDER — SODIUM CHLORIDE (PF) 0.9 % IJ SOLN
INTRAMUSCULAR | Status: AC
  Filled 2018-12-09: qty 50

## 2018-12-09 MED ORDER — FLUTICASONE FUROATE-VILANTEROL 200-25 MCG/INH IN AEPB
1.0000 | INHALATION_SPRAY | Freq: Every day | RESPIRATORY_TRACT | Status: DC
  Administered 2018-12-10 – 2018-12-11 (×2): 1 via RESPIRATORY_TRACT
  Filled 2018-12-09: qty 28

## 2018-12-09 MED ORDER — MORPHINE SULFATE (PF) 4 MG/ML IV SOLN
4.0000 mg | Freq: Once | INTRAVENOUS | Status: AC
  Administered 2018-12-09: 4 mg via INTRAVENOUS
  Filled 2018-12-09: qty 1

## 2018-12-09 MED ORDER — SODIUM CHLORIDE 0.9 % IV SOLN
1.0000 g | Freq: Once | INTRAVENOUS | Status: AC
  Administered 2018-12-09: 1 g via INTRAVENOUS
  Filled 2018-12-09: qty 10

## 2018-12-09 MED ORDER — ALBUTEROL SULFATE (2.5 MG/3ML) 0.083% IN NEBU
5.0000 mg | INHALATION_SOLUTION | Freq: Once | RESPIRATORY_TRACT | Status: AC
  Administered 2018-12-09: 5 mg via RESPIRATORY_TRACT
  Filled 2018-12-09: qty 6

## 2018-12-09 MED ORDER — ENOXAPARIN SODIUM 40 MG/0.4ML ~~LOC~~ SOLN
40.0000 mg | SUBCUTANEOUS | Status: DC
  Administered 2018-12-09 – 2018-12-10 (×2): 40 mg via SUBCUTANEOUS
  Filled 2018-12-09 (×2): qty 0.4

## 2018-12-09 MED ORDER — IOPAMIDOL (ISOVUE-370) INJECTION 76%
INTRAVENOUS | Status: AC
  Filled 2018-12-09: qty 100

## 2018-12-09 MED ORDER — SODIUM CHLORIDE 0.9 % IV SOLN
INTRAVENOUS | Status: DC | PRN
  Administered 2018-12-09 – 2018-12-15 (×3): 500 mL via INTRAVENOUS

## 2018-12-09 MED ORDER — ALBUTEROL SULFATE (2.5 MG/3ML) 0.083% IN NEBU
3.0000 mL | INHALATION_SOLUTION | Freq: Four times a day (QID) | RESPIRATORY_TRACT | Status: DC | PRN
  Administered 2018-12-09 – 2018-12-10 (×4): 3 mL via RESPIRATORY_TRACT
  Filled 2018-12-09 (×4): qty 3

## 2018-12-09 MED ORDER — SODIUM CHLORIDE 0.9 % IV SOLN
1.0000 g | Freq: Three times a day (TID) | INTRAVENOUS | Status: DC
  Administered 2018-12-09 – 2018-12-14 (×14): 1 g via INTRAVENOUS
  Filled 2018-12-09 (×18): qty 1

## 2018-12-09 MED ORDER — GUAIFENESIN-DM 100-10 MG/5ML PO SYRP
5.0000 mL | ORAL_SOLUTION | ORAL | Status: DC | PRN
  Administered 2018-12-10: 5 mL via ORAL
  Filled 2018-12-09: qty 10

## 2018-12-09 MED ORDER — ENSURE ENLIVE PO LIQD: 237.0000 mL | Freq: Two times a day (BID) | ORAL | Status: DC

## 2018-12-09 MED ORDER — MELATONIN 5 MG PO TABS
10.0000 mg | ORAL_TABLET | Freq: Every day | ORAL | Status: DC
  Administered 2018-12-09 – 2018-12-10 (×2): 10 mg via ORAL
  Filled 2018-12-09 (×2): qty 2

## 2018-12-09 MED ORDER — IOPAMIDOL (ISOVUE-370) INJECTION 76%
100.0000 mL | Freq: Once | INTRAVENOUS | Status: AC | PRN
  Administered 2018-12-09: 100 mL via INTRAVENOUS

## 2018-12-09 MED ORDER — SODIUM CHLORIDE 0.9 % IV SOLN
INTRAVENOUS | Status: DC
  Administered 2018-12-09 – 2018-12-13 (×4): via INTRAVENOUS

## 2018-12-09 MED ORDER — SODIUM CHLORIDE 0.9 % IV BOLUS
1000.0000 mL | Freq: Once | INTRAVENOUS | Status: AC
  Administered 2018-12-09: 1000 mL via INTRAVENOUS

## 2018-12-09 MED ORDER — SODIUM CHLORIDE 0.9 % IV SOLN: 500.0000 mg | Freq: Once | INTRAVENOUS | Status: DC

## 2018-12-09 MED ORDER — IPRATROPIUM BROMIDE 0.02 % IN SOLN
0.5000 mg | Freq: Once | RESPIRATORY_TRACT | Status: AC
  Administered 2018-12-09: 0.5 mg via RESPIRATORY_TRACT
  Filled 2018-12-09: qty 2.5

## 2018-12-09 MED ORDER — VANCOMYCIN HCL 10 G IV SOLR
1250.0000 mg | Freq: Once | INTRAVENOUS | Status: AC
  Administered 2018-12-09: 1250 mg via INTRAVENOUS
  Filled 2018-12-09: qty 1250

## 2018-12-09 MED ORDER — LORAZEPAM 0.5 MG PO TABS: 0.5000 mg | ORAL_TABLET | Freq: Three times a day (TID) | ORAL | Status: DC | PRN

## 2018-12-09 MED ORDER — VANCOMYCIN HCL 10 G IV SOLR
1250.0000 mg | INTRAVENOUS | Status: AC
  Administered 2018-12-10: 1250 mg via INTRAVENOUS
  Filled 2018-12-09: qty 1250

## 2018-12-09 NOTE — Progress Notes (Signed)
Dr ordered NTS for pt. Procedure explained to pt and family. Pt decided he did not want this done. Discussed other alternatives. Flutter valve was instructed to pt and he used appropriately. Sit pt up fully for cough effort, but non-productive. Instructed pt that he can use flutter at any time to help assist with mucous  clearing. RT will continue to monitor.

## 2018-12-09 NOTE — ED Notes (Signed)
ED TO INPATIENT HANDOFF REPORT  Name/Age/Gender Christopher Mora 68 y.o. male  Code Status Code Status History    Date Active Date Inactive Code Status Order ID Comments User Context   11/15/2018 2253 11/16/2018 1704 Full Code [id number]  Welford Roche, MD Inpatient   12/23/2012 1207 12/27/2012 1253 Full Code [id number]  Diamantina Monks, RN Inpatient   04/16/2012 0112 04/22/2012 1551 Full Code [id number]  Darliss Cheney, RN Inpatient      Home/SNF/Other Home  Chief Complaint failure to thrive  Level of Care/Admitting Diagnosis ED Disposition    ED Disposition Condition [facility] Area: [facility] [[id number]]  Level of Care: Telemetry [5]  Admit to tele based on following criteria: Other see comments  Comments: ACUTE HYPOXIC R4ESP FAILURE, PNA, MET LUNG DZ  Diagnosis: HCAP (healthcare-associated pneumonia) [338250]  Admitting Physician: Marcell Anger [[id number]]  Attending Physician: Marcell Anger [[id number]]  PT Class (Do Not Modify): Observation [104]  PT Acc Code (Do Not Modify): Observation [10022]       Medical History Past Medical History:  Diagnosis Date  . Asthma   . Cancer ([place])   . Cough "for a while"   nonproductive  . Pneumonia dec 2012  . Shortness of breath    occasional    Allergies Allergies  Allergen Reactions  . Oxycodone Nausea Only    IV Location/Drains/Wounds Patient Lines/Drains/Airways Status   Active Line/Drains/Airways    Name:   Placement date:   Placement time:   Site:   Days:   Peripheral IV 12/09/18 Left Forearm   12/09/18    1035    Forearm   less than 1   Colostomy LLQ   04/15/12    2159    LLQ   2429   Incision 04/15/12 Abdomen   04/15/12    2057     2429   Incision 12/23/12 Abdomen Other (Comment)   12/23/12    0923     2177   Incision 12/23/12 Perineum Other (Comment)   12/23/12    0923     2177          Labs/Imaging Results for orders placed or performed during  the hospital encounter of 12/09/18 (from the past 48 hour(s))  CBC with Differential/Platelet     Status: Abnormal   Collection Time: 12/09/18 10:35 AM  Result Value Ref Range   WBC 12.8 (H) 4.0 - 10.5 K/uL   RBC 4.73 4.22 - 5.81 MIL/uL   Hemoglobin 14.2 13.0 - 17.0 g/dL   HCT 45.1 39.0 - 52.0 %   MCV 95.3 80.0 - 100.0 fL   MCH 30.0 26.0 - 34.0 pg   MCHC 31.5 30.0 - 36.0 g/dL   RDW 13.0 11.5 - 15.5 %   Platelets 527 (H) 150 - 400 K/uL   nRBC 0.0 0.0 - 0.2 %   Neutrophils Relative % 77 %   Neutro Abs 10.0 (H) 1.7 - 7.7 K/uL   Lymphocytes Relative 8 %   Lymphs Abs 1.0 0.7 - 4.0 K/uL   Monocytes Relative 13 %   Monocytes Absolute 1.6 (H) 0.1 - 1.0 K/uL   Eosinophils Relative 1 %   Eosinophils Absolute 0.1 0.0 - 0.5 K/uL   Basophils Relative 0 %   Basophils Absolute 0.0 0.0 - 0.1 K/uL   Immature Granulocytes 1 %   Abs Immature Granulocytes 0.09 (H) 0.00 - 0.07 K/uL    Comment: Performed at [facility],  [place] [address], [place], [place] [address]  Comprehensive metabolic panel     Status: Abnormal   Collection Time: 12/09/18 10:35 AM  Result Value Ref Range   Sodium 138 135 - 145 mmol/L   Potassium 4.2 3.5 - 5.1 mmol/L   Chloride 98 98 - 111 mmol/L   CO2 27 22 - 32 mmol/L   Glucose, Bld 109 (H) 70 - 99 mg/dL   BUN 26 (H) 8 - 23 mg/dL   Creatinine, Ser 0.67 0.61 - 1.24 mg/dL   Calcium 9.4 8.9 - 10.3 mg/dL   Total Protein 6.6 6.5 - 8.1 g/dL   Albumin 2.7 (L) 3.5 - 5.0 g/dL   AST 37 15 - 41 U/L   ALT 43 0 - 44 U/L   Alkaline Phosphatase 88 38 - 126 U/L   Total Bilirubin 0.9 0.3 - 1.2 mg/dL   GFR calc non Af Amer >60 >60 mL/min   GFR calc Af Amer >60 >60 mL/min   Anion gap 13 5 - 15    Comment: Performed at [facility], [place] [address], [place], [place] [address]   Dg Chest 2 View  Result Date: 12/09/2018 CLINICAL DATA:  Cough and shortness of breath. History of metastatic lung cancer. EXAM: CHEST - 2 VIEW COMPARISON:  Chest x-ray dated  November 15, 2018. FINDINGS: The heart size and mediastinal contours are within normal limits. Normal pulmonary vascularity. Atherosclerotic calcification of the aortic arch. The lungs remain hyperinflated with emphysematous changes. Unchanged right suprahilar fullness. Progressive consolidation in the posterior right upper lobe. No pleural effusion or pneumothorax. No acute osseous abnormality. IMPRESSION: 1. Right upper lobe pneumonia, possibly postobstructive given right suprahilar fullness and history of metastatic lung cancer. 2. COPD. Electronically Signed   By: Titus Dubin M.D.   On: 12/09/2018 11:08   Ct Angio Chest Pe W And/or Wo Contrast  Result Date: 12/09/2018 CLINICAL DATA:  Weakness with choking and coughing when eating. Stage IV lung cancer. EXAM: CT ANGIOGRAPHY CHEST WITH CONTRAST TECHNIQUE: Multidetector CT imaging of the chest was performed using the standard protocol during bolus administration of intravenous contrast. Multiplanar CT image reconstructions and MIPs were obtained to evaluate the vascular anatomy. CONTRAST:  136m ISOVUE-370 IOPAMIDOL (ISOVUE-370) INJECTION 76% COMPARISON:  Chest CT 11/15/2018. Radiographs 12/09/2018 and 11/15/2018. FINDINGS: Cardiovascular: The pulmonary arteries are well opacified with contrast to the level of the subsegmental branches. There is no evidence of acute pulmonary embolism. There is atherosclerosis of the aorta, great vessels and coronary arteries. The heart size is normal. There is no pericardial effusion. Mediastinum/Nodes: There is confluent soft tissue in the mediastinum and hilar regions, suspicious for persistent lymphadenopathy.Right paratracheal (20 mm on image 39/4) and AP window (18 mm on image 49/4) components are similar to the previous study. Subcarinal component appears worse, measuring 25 mm on image 56/4. No axillary adenopathy. The thyroid gland, trachea and esophagus demonstrate no significant findings. Lungs/Pleura: There is  no pleural effusion. Severe centrilobular and paraseptal emphysema again noted. As seen on recent radiographs, there is consolidation posteriorly in the right upper lobe, most consistent with pneumonia. In addition, there is progressive dependent airspace opacity in both lower lobes. In addition to these probable inflammatory changes, there are several enlarging nodules in the left lung, including a 2.1 x 2.0 cm upper lobe lesion on image 93/10, a 1.9 x 1.3 cm lesion along the inferior medial aspect of the left major fissure (image 145/10) and an 1.1 x 1.5 cm left lower lobe  nodule on image 147/10. There are multiple other smaller nodules. A predominately linear 2.2 x 1.0 cm left upper lobe lesion on image 66/10 is stable, probably scarring. No suspicious nodule seen within the aerated portions of the right lung. Upper abdomen: Evaluation of the upper abdomen is limited by paucity of fat. There are bilateral adrenal masses suspicious for metastatic disease. Left adrenal mass measures 3.0 x 2.2 cm on image 114/4. Right adrenal mass is less well-defined and difficult to separate from possible adjacent retroperitoneal adenopathy, measuring up to 4.1 x 3.4 cm on image 113/4. There is a soft tissue nodule posterior to the liver measuring 2.7 x 1.3 cm on image 107/4. No definite hepatic lesions seen. Musculoskeletal/Chest wall: There are multiple lytic osseous lesions consistent with metastatic disease. These are most prominent within the T3 vertebral body (sagittal image 96/7), in the right 5th rib near the costovertebral junction and posteriorly in the right 8th rib. Several other smaller rib metastases are noted. No definite pathologic fracture or epidural tumor in the spine. Review of the MIP images confirms the above findings. IMPRESSION: 1. No evidence of acute pulmonary embolism. 2. Right upper lobe and bibasilar consolidation most consistent with pneumonia. 3. Progressive mediastinal lymphadenopathy, bilateral  pulmonary nodularity and osseous metastatic disease consistent with stage IV lung cancer. In addition, there are bilateral adrenal nodules and increased nodularity in the upper abdomen, also worrisome for progressive metastatic disease. Electronically Signed   By: Richardean Sale M.D.   On: 12/09/2018 13:59   EKG Interpretation  Date/Time:  Monday December 09 2018 10:36:59 EST Ventricular Rate:  115 PR Interval:    QRS Duration: 89 QT Interval:  306 QTC Calculation: 424 R Axis:   90 Text Interpretation:  Sinus tachycardia Ventricular premature complex Right atrial enlargement Borderline right axis deviation Repol abnrm suggests ischemia, inferior leads No significant change was found Confirmed by Jola Schmidt [id number]) on 12/09/2018 3:19:31 PM   Pending Labs Unresulted Labs (From admission, onward)    Start     Ordered   12/09/18 1519  Influenza panel by PCR (type A & B)  (Influenza PCR Panel)  Once,   R     12/09/18 1518          Vitals/Pain Today's Vitals   12/09/18 1430 12/09/18 1500 12/09/18 1530 12/09/18 1545  BP: (!) 142/83 121/82 138/82   Pulse: (!) 109 (!) 105 (!) 107   Resp: (!) 22 16 (!) 21   Temp:      TempSrc:      SpO2: 93% 94% 92%   Weight:      Height:      PainSc:    0-No pain    Isolation Precautions Droplet precaution  Medications Medications  sodium chloride (PF) 0.9 % injection (has no administration in time range)  iopamidol (ISOVUE-370) 76 % injection (has no administration in time range)  cefTRIAXone (ROCEPHIN) 1 g in sodium chloride 0.9 % 100 mL IVPB (1 g Intravenous New Bag/Given 12/09/18 1548)  azithromycin (ZITHROMAX) 500 mg in sodium chloride 0.9 % 250 mL IVPB (has no administration in time range)  0.9 %  sodium chloride infusion (500 mLs Intravenous New Bag/Given 12/09/18 1547)  sodium chloride 0.9 % bolus 1,000 mL (0 mLs Intravenous Stopped 12/09/18 1235)  albuterol (PROVENTIL) (2.5 MG/3ML) 0.083% nebulizer solution 5 mg (5 mg Nebulization  Given 12/09/18 1059)  ipratropium (ATROVENT) nebulizer solution 0.5 mg (0.5 mg Nebulization Given 12/09/18 1059)  iopamidol (ISOVUE-370) 76 % injection 100 mL (100  mLs Intravenous Contrast Given 12/09/18 1316)  morphine 4 MG/ML injection 4 mg (4 mg Intravenous Given 12/09/18 1410)    Mobility walks with person assist

## 2018-12-09 NOTE — Progress Notes (Addendum)
Pharmacy Antibiotic Note  Christopher Mora is a 68 y.o. male admitted on 12/09/2018 with pneumonia.  Pharmacy has been consulted for vancomycin and cefepime dosing.  Plan: Cefepime 1 gm IV q8h vancomycin 1250 mg IV q24 for est AUC 535 (Scr 0.8, TBW/TBW 0.72) F/u renal function, WBC, temp, culture data vanc levels as needed  Height: 5\' 8"  (172.7 cm) Weight: 118 lb (53.5 kg) IBW/kg (Calculated) : 68.4  Temp (24hrs), Avg:98 F (36.7 C), Min:97.8 F (36.6 C), Max:98.2 F (36.8 C)  Recent Labs  Lab 12/09/18 1035  WBC 12.8*  CREATININE 0.67    Estimated Creatinine Clearance: 67.8 mL/min (by C-G formula based on SCr of 0.67 mg/dL).    Allergies  Allergen Reactions  . Oxycodone Nausea Only    Antimicrobials this admission: 1/13 vanc>> 1/13 cefepime>> 1/13 CTX x 1 dose Dose adjustments this admission:  Microbiology results: 1/13 flu negative 1/13 BCx2>>sent 1/13 sputum>ordered 1/13 strep pneumo Uag>> 1/13 HIV>> 1/13 MRSA PCR>>  Thank you for allowing pharmacy to be a part of this patient's care.  Eudelia Bunch, Pharm.D 12/09/2018 5:23 PM

## 2018-12-09 NOTE — ED Provider Notes (Signed)
[facility] DEPT Provider Note   CSN: [id number] Arrival date & time: 12/09/18  1829     History   Chief Complaint Chief Complaint  Patient presents with  . Weakness  . Failure To Thrive    HPI Christopher Mora is a 68 y.o. male.  HPI Patient is a 68 year old male with stage IV lung cancer presents the emergency department with generalized weakness and a feeling like increased mucus production and inability to get up.  He states this is making him feel short of breath.  He is on 2.5 L home oxygen and was found hypoxic for EMS and was placed on 4 L nasal cannula.  He denies fever.  He reports thick mucus production but feels like he is unable to get the phlegm up.  He has not tried bronchodilators at home.  He does have a history of COPD.  He has not initiated treatment for his cancer to this point.  He states they are getting begin radiation chemotherapy at [facility] soon.   Past Medical History:  Diagnosis Date  . Asthma   . Cancer ([place])   . Cough "for a while"   nonproductive  . Pneumonia dec 2012  . Shortness of breath    occasional    Patient Active Problem List   Diagnosis Date Noted  . Acute dyspnea 11/15/2018  . Perforated diverticulitis 05/02/2012    Past Surgical History:  Procedure Laterality Date  . APPENDECTOMY  04/15/2012   Procedure: APPENDECTOMY;  Surgeon: Edward Jolly, MD;  Location: [facility] ORS;  Service: General;;  . CATARACT EXTRACTION    . COLON SURGERY    . COLOSTOMY  04/15/2012   Procedure: COLOSTOMY;  Surgeon: Edward Jolly, MD;  Location: [facility] ORS;  Service: General;;  . COLOSTOMY TAKEDOWN  12/23/2012   Procedure: COLOSTOMY TAKEDOWN;  Surgeon: Edward Jolly, MD;  Location: [facility] ORS;  Service: General;  Laterality: N/A;  TAKEDOWN OF [place] COLOSTOMY   . INTRAOCULAR LENS IMPLANT, SECONDARY    . KNEE SURGERY  1973   Left  . KNEE SURGERY    . LAPAROTOMY  04/15/2012   Procedure: EXPLORATORY LAPAROTOMY;   Surgeon: Edward Jolly, MD;  Location: [facility] ORS;  Service: General;  Laterality: N/A;  . PARTIAL COLECTOMY  04/15/2012   Procedure: PARTIAL COLECTOMY;  Surgeon: Edward Jolly, MD;  Location: [facility] ORS;  Service: General;;  sigmoid colectomy  . SHOULDER SURGERY  2009  . SHOULDER SURGERY          Home Medications    Prior to Admission medications   Medication Sig Start Date End Date Taking? Authorizing Provider  Fluticasone-Salmeterol (ADVAIR DISKUS) 250-50 MCG/DOSE AEPB Inhale 1 puff into the lungs 2 (two) times daily. 02/05/17  Yes [provider]  LORazepam (ATIVAN) 0.5 MG tablet Take 0.5 mg by mouth 3 (three) times daily as needed for anxiety. 11/16/17  Yes [provider]  Melatonin 10 MG TABS Take 10 mg by mouth at bedtime.   Yes [provider]  PROAIR HFA 108 (90 Base) MCG/ACT inhaler Inhale 2 puffs into the lungs every 6 (six) hours as needed for wheezing or shortness of breath. 01/01/18  Yes [provider]    Family History Family History  Problem Relation Age of Onset  . Alzheimer's disease Father     Social History Social History   Tobacco Use  . Smoking status: Current Every Day Smoker    Packs/day: 5.00    Years:  40.00    Pack years: 200.00    Types: Cigars  . Smokeless tobacco: Never Used  Substance Use Topics  . Alcohol use: No  . Drug use: Yes    Types: Marijuana    Comment: occasional marijuana     Allergies   Oxycodone   Review of Systems Review of Systems  All other systems reviewed and are negative.    Physical Exam Updated Vital Signs BP 117/89 (BP Location: Right Arm)   Pulse (!) 116   Temp 98.2 F (36.8 C) (Oral)   Resp 19   Ht 5\' 8"  (1.727 m)   Wt 53.5 kg   SpO2 92%   BMI 17.94 kg/m   Physical Exam Vitals signs and nursing note reviewed.  Constitutional:      Appearance: He is well-developed.  HENT:     Head: Normocephalic and atraumatic.  Neck:     Musculoskeletal: Normal range  of motion.  Cardiovascular:     Rate and Rhythm: Normal rate and regular rhythm.     Heart sounds: Normal heart sounds.  Pulmonary:     Effort: Pulmonary effort is normal. No respiratory distress.     Breath sounds: Normal breath sounds.  Abdominal:     General: There is no distension.     Palpations: Abdomen is soft.     Tenderness: There is no abdominal tenderness.  Musculoskeletal: Normal range of motion.  Skin:    General: Skin is warm and dry.  Neurological:     Mental Status: He is alert and oriented to person, place, and time.  Psychiatric:        Judgment: Judgment normal.      ED Treatments / Results  Labs (all labs ordered are listed, but only abnormal results are displayed) Labs Reviewed  CBC WITH DIFFERENTIAL/PLATELET  COMPREHENSIVE METABOLIC PANEL    EKG EKG Interpretation  Date/Time:  Monday December 09 2018 10:36:59 EST Ventricular Rate:  115 PR Interval:    QRS Duration: 89 QT Interval:  306 QTC Calculation: 424 R Axis:   90 Text Interpretation:  Sinus tachycardia Ventricular premature complex Right atrial enlargement Borderline right axis deviation Repol abnrm suggests ischemia, inferior leads No significant change was found Confirmed by Jola Schmidt [phone]) on 12/09/2018 3:19:31 PM   Radiology Dg Chest 2 View  Result Date: 12/09/2018 CLINICAL DATA:  Cough and shortness of breath. History of metastatic lung cancer. EXAM: CHEST - 2 VIEW COMPARISON:  Chest x-ray dated November 15, 2018. FINDINGS: The heart size and mediastinal contours are within normal limits. Normal pulmonary vascularity. Atherosclerotic calcification of the aortic arch. The lungs remain hyperinflated with emphysematous changes. Unchanged right suprahilar fullness. Progressive consolidation in the posterior right upper lobe. No pleural effusion or pneumothorax. No acute osseous abnormality. IMPRESSION: 1. Right upper lobe pneumonia, possibly postobstructive given right suprahilar fullness  and history of metastatic lung cancer. 2. COPD. Electronically Signed   By: Titus Dubin M.D.   On: 12/09/2018 11:08   Ct Angio Chest Pe W And/or Wo Contrast  Result Date: 12/09/2018 CLINICAL DATA:  Weakness with choking and coughing when eating. Stage IV lung cancer. EXAM: CT ANGIOGRAPHY CHEST WITH CONTRAST TECHNIQUE: Multidetector CT imaging of the chest was performed using the standard protocol during bolus administration of intravenous contrast. Multiplanar CT image reconstructions and MIPs were obtained to evaluate the vascular anatomy. CONTRAST:  132mL ISOVUE-370 IOPAMIDOL (ISOVUE-370) INJECTION 76% COMPARISON:  Chest CT 11/15/2018. Radiographs 12/09/2018 and 11/15/2018. FINDINGS: Cardiovascular: The pulmonary arteries are well  opacified with contrast to the level of the subsegmental branches. There is no evidence of acute pulmonary embolism. There is atherosclerosis of the aorta, great vessels and coronary arteries. The heart size is normal. There is no pericardial effusion. Mediastinum/Nodes: There is confluent soft tissue in the mediastinum and hilar regions, suspicious for persistent lymphadenopathy.Right paratracheal (20 mm on image 39/4) and AP window (18 mm on image 49/4) components are similar to the previous study. Subcarinal component appears worse, measuring 25 mm on image 56/4. No axillary adenopathy. The thyroid gland, trachea and esophagus demonstrate no significant findings. Lungs/Pleura: There is no pleural effusion. Severe centrilobular and paraseptal emphysema again noted. As seen on recent radiographs, there is consolidation posteriorly in the right upper lobe, most consistent with pneumonia. In addition, there is progressive dependent airspace opacity in both lower lobes. In addition to these probable inflammatory changes, there are several enlarging nodules in the left lung, including a 2.1 x 2.0 cm upper lobe lesion on image 93/10, a 1.9 x 1.3 cm lesion along the inferior medial  aspect of the left major fissure (image 145/10) and an 1.1 x 1.5 cm left lower lobe nodule on image 147/10. There are multiple other smaller nodules. A predominately linear 2.2 x 1.0 cm left upper lobe lesion on image 66/10 is stable, probably scarring. No suspicious nodule seen within the aerated portions of the right lung. Upper abdomen: Evaluation of the upper abdomen is limited by paucity of fat. There are bilateral adrenal masses suspicious for metastatic disease. Left adrenal mass measures 3.0 x 2.2 cm on image 114/4. Right adrenal mass is less well-defined and difficult to separate from possible adjacent retroperitoneal adenopathy, measuring up to 4.1 x 3.4 cm on image 113/4. There is a soft tissue nodule posterior to the liver measuring 2.7 x 1.3 cm on image 107/4. No definite hepatic lesions seen. Musculoskeletal/Chest wall: There are multiple lytic osseous lesions consistent with metastatic disease. These are most prominent within the T3 vertebral body (sagittal image 96/7), in the right 5th rib near the costovertebral junction and posteriorly in the right 8th rib. Several other smaller rib metastases are noted. No definite pathologic fracture or epidural tumor in the spine. Review of the MIP images confirms the above findings. IMPRESSION: 1. No evidence of acute pulmonary embolism. 2. Right upper lobe and bibasilar consolidation most consistent with pneumonia. 3. Progressive mediastinal lymphadenopathy, bilateral pulmonary nodularity and osseous metastatic disease consistent with stage IV lung cancer. In addition, there are bilateral adrenal nodules and increased nodularity in the upper abdomen, also worrisome for progressive metastatic disease. Electronically Signed   By: Richardean Sale M.D.   On: 12/09/2018 13:59    Procedures .Critical Care Performed by: Jola Schmidt, MD Authorized by: Jola Schmidt, MD   Critical care provider statement:    Critical care time (minutes):  31   Critical care  was time spent personally by me on the following activities:  Discussions with consultants, evaluation of patient's response to treatment, examination of patient, ordering and performing treatments and interventions, ordering and review of laboratory studies, ordering and review of radiographic studies, pulse oximetry, re-evaluation of patient's condition, obtaining history from patient or surrogate and review of old charts   (including critical care time)  Medications Ordered in ED Medications  sodium chloride (PF) 0.9 % injection (has no administration in time range)  iopamidol (ISOVUE-370) 76 % injection (has no administration in time range)  cefTRIAXone (ROCEPHIN) 1 g in sodium chloride 0.9 % 100 mL IVPB (has  no administration in time range)  azithromycin (ZITHROMAX) 500 mg in sodium chloride 0.9 % 250 mL IVPB (has no administration in time range)  sodium chloride 0.9 % bolus 1,000 mL (0 mLs Intravenous Stopped 12/09/18 1235)  albuterol (PROVENTIL) (2.5 MG/3ML) 0.083% nebulizer solution 5 mg (5 mg Nebulization Given 12/09/18 1059)  ipratropium (ATROVENT) nebulizer solution 0.5 mg (0.5 mg Nebulization Given 12/09/18 1059)  iopamidol (ISOVUE-370) 76 % injection 100 mL (100 mLs Intravenous Contrast Given 12/09/18 1316)  morphine 4 MG/ML injection 4 mg (4 mg Intravenous Given 12/09/18 1410)     Initial Impression / Assessment and Plan / ED Course  I have reviewed the triage vital signs and the nursing notes.  Pertinent labs & imaging results that were available during my care of the patient were reviewed by me and considered in my medical decision making (see chart for details).    Patient with developing pneumonia new hypoxia.  IV antibiotics now.  Patient will need admission the hospital.  He has had progression of his cancer.  Tried hospitalist admission  Final Clinical Impressions(s) / ED Diagnoses   Final diagnoses:  Community acquired pneumonia of right upper lobe of lung [facility])    Primary malignant neoplasm of lung metastatic to other site, unspecified laterality [facility])    ED Discharge Orders    None       Jola Schmidt, MD 12/09/18 1520

## 2018-12-09 NOTE — ED Notes (Signed)
Respiratory called for pt. Neb treatments.

## 2018-12-09 NOTE — ED Notes (Signed)
One attempt made to call report, RN assisting another pt and will call back asap.

## 2018-12-09 NOTE — H&P (Signed)
History and Physical    Christopher Mora KGU:[id number] DOB: 1951/02/28 DOA: 12/09/2018  PCP: [name], MD  Patient coming from: HOME  I have personally briefly reviewed patient's old medical records in [place]  Chief Complaint: SOB  HPI: Christopher Mora is a 68 y.o. male with medical history significant of stage IV adenocarcinoma of lung with metastatic disease to right retina who was previously seen at [facility] with biopsy-proven non-small cell lung cancer.  Patient presents to the ER today with shortness of breath, failure to thrive, and increased mucus production.  He reported at home is on 2.5 L and required 4 L in the ER to maintain sats in the low to mid 90s.  He denied fevers at home did endorse as noted mucus production, shortness of breath, decreased strength and general malaise as well as approximate 30 pound weight loss in 2 months.  He did report history of COPD with tobacco dependence of approximately 40 pack years last used October 2019.  Patient has been seen at [facility] but indicated he wanted to move his care here.  In the ER CT was notable for pneumonia without PE as well as worsening disease..  ED Course: CT as above, CBC with a white count 12.8, I have ordered blood cultures, and patient was given azithromycin and ceftriaxone in the ER which I have changed to vancomycin and cefepime on the order set for inpatient hemodynamically patient is afebrile mildly tach in the low 100s respirations teens to 22 with a blood pressure 124/80.  Review of Systems: As per HPI otherwise 10 point review of systems negative.    Past Medical History:  Diagnosis Date  . Asthma   . Cancer ([place])   . Cough "for a while"   nonproductive  . Pneumonia dec 2012  . Shortness of breath    occasional    Past Surgical History:  Procedure Laterality Date  . APPENDECTOMY  04/15/2012   Procedure: APPENDECTOMY;  Surgeon: Edward Jolly, MD;  Location: [facility];  Service:  General;;  . CATARACT EXTRACTION    . COLON SURGERY    . COLOSTOMY  04/15/2012   Procedure: COLOSTOMY;  Surgeon: Edward Jolly, MD;  Location: [facility];  Service: General;;  . COLOSTOMY TAKEDOWN  12/23/2012   Procedure: COLOSTOMY TAKEDOWN;  Surgeon: Edward Jolly, MD;  Location: [facility];  Service: General;  Laterality: N/A;  TAKEDOWN OF HARTMAN COLOSTOMY   . INTRAOCULAR LENS IMPLANT, SECONDARY    . KNEE SURGERY  1973   Left  . KNEE SURGERY    . LAPAROTOMY  04/15/2012   Procedure: EXPLORATORY LAPAROTOMY;  Surgeon: Edward Jolly, MD;  Location: [facility];  Service: General;  Laterality: N/A;  . PARTIAL COLECTOMY  04/15/2012   Procedure: PARTIAL COLECTOMY;  Surgeon: Edward Jolly, MD;  Location: [facility];  Service: General;;  sigmoid colectomy  . SHOULDER SURGERY  2009  . SHOULDER SURGERY       reports that he has been smoking cigars. He has a 200.00 pack-year smoking history. He has never used smokeless tobacco. He reports current drug use. Drug: Marijuana. He reports that he does not drink alcohol.  Allergies  Allergen Reactions  . Oxycodone Nausea Only    Family History  Problem Relation Age of Onset  . Alzheimer's disease Father      Prior to Admission medications   Medication Sig Start Date End Date Taking? Authorizing Provider  Fluticasone-Salmeterol (ADVAIR DISKUS) 250-50 MCG/DOSE  AEPB Inhale 1 puff into the lungs 2 (two) times daily. 02/05/17  Yes [provider]  LORazepam (ATIVAN) 0.5 MG tablet Take 0.5 mg by mouth 3 (three) times daily as needed for anxiety. 11/16/17  Yes [provider]  Melatonin 10 MG TABS Take 10 mg by mouth at bedtime.   Yes [provider]  PROAIR HFA 108 (90 Base) MCG/ACT inhaler Inhale 2 puffs into the lungs every 6 (six) hours as needed for wheezing or shortness of breath. 01/01/18  Yes [provider]    Physical Exam: Vitals:   12/09/18 1430 12/09/18 1500 12/09/18 1530 12/09/18 1616  BP: (!)  142/83 121/82 138/82 124/80  Pulse: (!) 109 (!) 105 (!) 107 (!) 105  Resp: (!) 22 16 (!) 21 17  Temp:    98 F (36.7 C)  TempSrc:    Oral  SpO2: 93% 94% 92% 92%  Weight:      Height:        Constitutional: NAD, calm, comfortable Vitals:   12/09/18 1430 12/09/18 1500 12/09/18 1530 12/09/18 1616  BP: (!) 142/83 121/82 138/82 124/80  Pulse: (!) 109 (!) 105 (!) 107 (!) 105  Resp: (!) 22 16 (!) 21 17  Temp:    98 F (36.7 C)  TempSrc:    Oral  SpO2: 93% 94% 92% 92%  Weight:      Height:       Eyes: PERRL, lids and conjunctivae normal ENMT: Mucous membranes are MOIST  posterior pharynx clear of any exudate or lesions.Normal dentition.  Neck: normal, supple, no masses, no thyromegaly Respiratory: Mild rales bilaterally, no accessory muscle use Cardiovascular: Regular rate and rhythm, no murmurs / rubs / gallops. No extremity edema. 2+ pedal pulses. No carotid bruits.  Abdomen: no tenderness, no masses palpated. No hepatosplenomegaly. Bowel sounds positive.  Musculoskeletal: Cachectic appearing, no clubbing / cyanosis. No joint deformity upper and lower extremities. Good ROM, no contractures.  Globally weak.  Skin: no rashes, lesions, ulcers. No induration Neurologic: CN 2-12 grossly intact. Sensation intact, DTR normal. Strength 5/5 in all 4 without focal deficit but patient globally weak and cachectic Psychiatric: Normal judgment and insight. Alert and oriented x 3. Normal mood.    Labs on Admission: I have personally reviewed following labs and imaging studies  CBC: Recent Labs  Lab 12/09/18 1035  WBC 12.8*  NEUTROABS 10.0*  HGB 14.2  HCT 45.1  MCV 95.3  PLT 161*   Basic Metabolic Panel: Recent Labs  Lab 12/09/18 1035  NA 138  K 4.2  CL 98  CO2 27  GLUCOSE 109*  BUN 26*  CREATININE 0.67  CALCIUM 9.4   GFR: Estimated Creatinine Clearance: 67.8 mL/min (by C-G formula based on SCr of 0.67 mg/dL). Liver Function Tests: Recent Labs  Lab 12/09/18 1035  AST 37   ALT 43  ALKPHOS 88  BILITOT 0.9  PROT 6.6  ALBUMIN 2.7*   No results for input(s): LIPASE, AMYLASE in the last 168 hours. No results for input(s): AMMONIA in the last 168 hours. Coagulation Profile: No results for input(s): INR, PROTIME in the last 168 hours. Cardiac Enzymes: No results for input(s): CKTOTAL, CKMB, CKMBINDEX, TROPONINI in the last 168 hours. BNP (last 3 results) No results for input(s): PROBNP in the last 8760 hours. HbA1C: No results for input(s): HGBA1C in the last 72 hours. CBG: No results for input(s): GLUCAP in the last 168 hours. Lipid Profile: No results for input(s): CHOL, HDL, LDLCALC, TRIG, CHOLHDL, LDLDIRECT in  the last 72 hours. Thyroid Function Tests: No results for input(s): TSH, T4TOTAL, FREET4, T3FREE, THYROIDAB in the last 72 hours. Anemia Panel: No results for input(s): VITAMINB12, FOLATE, FERRITIN, TIBC, IRON, RETICCTPCT in the last 72 hours. Urine analysis:    Component Value Date/Time   COLORURINE AMBER (A) 04/15/2012 1653   APPEARANCEUR CLOUDY (A) 04/15/2012 1653   LABSPEC 1.024 04/15/2012 1653   PHURINE 6.0 04/15/2012 1653   GLUCOSEU NEGATIVE 04/15/2012 1653   HGBUR NEGATIVE 04/15/2012 1653   BILIRUBINUR SMALL (A) 04/15/2012 1653   KETONESUR NEGATIVE 04/15/2012 1653   PROTEINUR NEGATIVE 04/15/2012 1653   UROBILINOGEN 1.0 04/15/2012 1653   NITRITE NEGATIVE 04/15/2012 1653   LEUKOCYTESUR TRACE (A) 04/15/2012 1653    Radiological Exams on Admission: Dg Chest 2 View  Result Date: 12/09/2018 CLINICAL DATA:  Cough and shortness of breath. History of metastatic lung cancer. EXAM: CHEST - 2 VIEW COMPARISON:  Chest x-ray dated November 15, 2018. FINDINGS: The heart size and mediastinal contours are within normal limits. Normal pulmonary vascularity. Atherosclerotic calcification of the aortic arch. The lungs remain hyperinflated with emphysematous changes. Unchanged right suprahilar fullness. Progressive consolidation in the posterior right  upper lobe. No pleural effusion or pneumothorax. No acute osseous abnormality. IMPRESSION: 1. Right upper lobe pneumonia, possibly postobstructive given right suprahilar fullness and history of metastatic lung cancer. 2. COPD. Electronically Signed   By: Titus Dubin M.D.   On: 12/09/2018 11:08   Ct Angio Chest Pe W And/or Wo Contrast  Result Date: 12/09/2018 CLINICAL DATA:  Weakness with choking and coughing when eating. Stage IV lung cancer. EXAM: CT ANGIOGRAPHY CHEST WITH CONTRAST TECHNIQUE: Multidetector CT imaging of the chest was performed using the standard protocol during bolus administration of intravenous contrast. Multiplanar CT image reconstructions and MIPs were obtained to evaluate the vascular anatomy. CONTRAST:  123mL ISOVUE-370 IOPAMIDOL (ISOVUE-370) INJECTION 76% COMPARISON:  Chest CT 11/15/2018. Radiographs 12/09/2018 and 11/15/2018. FINDINGS: Cardiovascular: The pulmonary arteries are well opacified with contrast to the level of the subsegmental branches. There is no evidence of acute pulmonary embolism. There is atherosclerosis of the aorta, great vessels and coronary arteries. The heart size is normal. There is no pericardial effusion. Mediastinum/Nodes: There is confluent soft tissue in the mediastinum and hilar regions, suspicious for persistent lymphadenopathy.Right paratracheal (20 mm on image 39/4) and AP window (18 mm on image 49/4) components are similar to the previous study. Subcarinal component appears worse, measuring 25 mm on image 56/4. No axillary adenopathy. The thyroid gland, trachea and esophagus demonstrate no significant findings. Lungs/Pleura: There is no pleural effusion. Severe centrilobular and paraseptal emphysema again noted. As seen on recent radiographs, there is consolidation posteriorly in the right upper lobe, most consistent with pneumonia. In addition, there is progressive dependent airspace opacity in both lower lobes. In addition to these probable  inflammatory changes, there are several enlarging nodules in the left lung, including a 2.1 x 2.0 cm upper lobe lesion on image 93/10, a 1.9 x 1.3 cm lesion along the inferior medial aspect of the left major fissure (image 145/10) and an 1.1 x 1.5 cm left lower lobe nodule on image 147/10. There are multiple other smaller nodules. A predominately linear 2.2 x 1.0 cm left upper lobe lesion on image 66/10 is stable, probably scarring. No suspicious nodule seen within the aerated portions of the right lung. Upper abdomen: Evaluation of the upper abdomen is limited by paucity of fat. There are bilateral adrenal masses suspicious for metastatic disease. Left adrenal mass measures  3.0 x 2.2 cm on image 114/4. Right adrenal mass is less well-defined and difficult to separate from possible adjacent retroperitoneal adenopathy, measuring up to 4.1 x 3.4 cm on image 113/4. There is a soft tissue nodule posterior to the liver measuring 2.7 x 1.3 cm on image 107/4. No definite hepatic lesions seen. Musculoskeletal/Chest wall: There are multiple lytic osseous lesions consistent with metastatic disease. These are most prominent within the T3 vertebral body (sagittal image 96/7), in the right 5th rib near the costovertebral junction and posteriorly in the right 8th rib. Several other smaller rib metastases are noted. No definite pathologic fracture or epidural tumor in the spine. Review of the MIP images confirms the above findings. IMPRESSION: 1. No evidence of acute pulmonary embolism. 2. Right upper lobe and bibasilar consolidation most consistent with pneumonia. 3. Progressive mediastinal lymphadenopathy, bilateral pulmonary nodularity and osseous metastatic disease consistent with stage IV lung cancer. In addition, there are bilateral adrenal nodules and increased nodularity in the upper abdomen, also worrisome for progressive metastatic disease. Electronically Signed   By: Richardean Sale M.D.   On: 12/09/2018 13:59     EKG: Independently reviewed. SINUS TACH no acute ST changes noted  Assessment/Plan Active Problems:   HCAP (healthcare-associated pneumonia)   Adenocarcinoma of lung, stage 4, right (HCC)   Metastasis to nervous system and eye ([place])   Tobacco abuse, in remission   Cachexia [facility])  Health care associated pneumonia.  Can treat the patient with vancomycin and cefepime, follow-up blood cultures, follow-up CBC, PRN nebs, supplemental O2 to maintain sats 90 to 92%  Carcinoma of lung stage IV right with metastatic disease to the nervous system right eye.  Patient requested to move his care from [place] to oncology here he has not yet initiated chemo or radiation there.  He has biopsy-proven non-small cell per [facility] note.  Discussed the case with oncology who will further evaluate  Tobacco abuse in remission encouraged continued tobacco cessation  Cachexia.  Nutritional consultation ordered, patient did report a 30 pound weight loss consistent with his known metastatic disease  COPD continue patient's home medications he is not in acute exacerbation.  DVT prophylaxis: LOVENOX Code Status: FULL PER MY CONVERSATION WITH PT IN THE ER Family Communication: PARTNER AT BEDSIDE Disposition Plan: TO HOME 2 DAYS Consults called: ONCOLOGY DR Benay Spice Admission status: OBS TELE   Nicolette Bang MD [facility]  If 7PM-7AM, please contact night-coverage [email or web address] Password [facility]  12/09/2018, 4:50 PM

## 2018-12-09 NOTE — ED Notes (Signed)
Patient transported to CT 

## 2018-12-09 NOTE — ED Triage Notes (Signed)
Per EMS- Patient is from home. Patient has stage IV lung cancer. Patient c/o weakness, choking when he tries to eat and states he has not eaten adequately in the past few days.  Patient had Sats 87% on home O2 2.5 L/min via [place]. EMS increased O2 to 4L/min via [place] and sats increased to 95%

## 2018-12-10 ENCOUNTER — Other Ambulatory Visit: Payer: Self-pay

## 2018-12-10 DIAGNOSIS — J189 Pneumonia, unspecified organism: Secondary | ICD-10-CM | POA: Diagnosis not present

## 2018-12-10 DIAGNOSIS — R0600 Dyspnea, unspecified: Secondary | ICD-10-CM | POA: Diagnosis not present

## 2018-12-10 DIAGNOSIS — J441 Chronic obstructive pulmonary disease with (acute) exacerbation: Secondary | ICD-10-CM | POA: Diagnosis present

## 2018-12-10 DIAGNOSIS — Z66 Do not resuscitate: Secondary | ICD-10-CM | POA: Diagnosis not present

## 2018-12-10 DIAGNOSIS — R627 Adult failure to thrive: Secondary | ICD-10-CM | POA: Diagnosis present

## 2018-12-10 DIAGNOSIS — Z9849 Cataract extraction status, unspecified eye: Secondary | ICD-10-CM | POA: Diagnosis not present

## 2018-12-10 DIAGNOSIS — Z820 Family history of epilepsy and other diseases of the nervous system: Secondary | ICD-10-CM | POA: Diagnosis not present

## 2018-12-10 DIAGNOSIS — G9340 Encephalopathy, unspecified: Secondary | ICD-10-CM | POA: Diagnosis not present

## 2018-12-10 DIAGNOSIS — I313 Pericardial effusion (noninflammatory): Secondary | ICD-10-CM | POA: Diagnosis present

## 2018-12-10 DIAGNOSIS — J9621 Acute and chronic respiratory failure with hypoxia: Secondary | ICD-10-CM | POA: Diagnosis not present

## 2018-12-10 DIAGNOSIS — R64 Cachexia: Secondary | ICD-10-CM | POA: Diagnosis present

## 2018-12-10 DIAGNOSIS — C3491 Malignant neoplasm of unspecified part of right bronchus or lung: Secondary | ICD-10-CM | POA: Diagnosis not present

## 2018-12-10 DIAGNOSIS — Z681 Body mass index (BMI) 19 or less, adult: Secondary | ICD-10-CM | POA: Diagnosis not present

## 2018-12-10 DIAGNOSIS — J9622 Acute and chronic respiratory failure with hypercapnia: Secondary | ICD-10-CM | POA: Diagnosis not present

## 2018-12-10 DIAGNOSIS — Z515 Encounter for palliative care: Secondary | ICD-10-CM | POA: Diagnosis not present

## 2018-12-10 DIAGNOSIS — R0603 Acute respiratory distress: Secondary | ICD-10-CM | POA: Diagnosis not present

## 2018-12-10 DIAGNOSIS — F17290 Nicotine dependence, other tobacco product, uncomplicated: Secondary | ICD-10-CM | POA: Diagnosis present

## 2018-12-10 DIAGNOSIS — J9601 Acute respiratory failure with hypoxia: Secondary | ICD-10-CM | POA: Diagnosis not present

## 2018-12-10 DIAGNOSIS — Z9981 Dependence on supplemental oxygen: Secondary | ICD-10-CM | POA: Diagnosis not present

## 2018-12-10 DIAGNOSIS — R1314 Dysphagia, pharyngoesophageal phase: Secondary | ICD-10-CM | POA: Diagnosis present

## 2018-12-10 DIAGNOSIS — Z885 Allergy status to narcotic agent status: Secondary | ICD-10-CM | POA: Diagnosis not present

## 2018-12-10 DIAGNOSIS — C3490 Malignant neoplasm of unspecified part of unspecified bronchus or lung: Secondary | ICD-10-CM | POA: Diagnosis present

## 2018-12-10 DIAGNOSIS — J690 Pneumonitis due to inhalation of food and vomit: Secondary | ICD-10-CM | POA: Diagnosis present

## 2018-12-10 DIAGNOSIS — I361 Nonrheumatic tricuspid (valve) insufficiency: Secondary | ICD-10-CM | POA: Diagnosis not present

## 2018-12-10 DIAGNOSIS — J181 Lobar pneumonia, unspecified organism: Secondary | ICD-10-CM | POA: Diagnosis not present

## 2018-12-10 DIAGNOSIS — J156 Pneumonia due to other aerobic Gram-negative bacteria: Secondary | ICD-10-CM | POA: Diagnosis present

## 2018-12-10 DIAGNOSIS — Z7951 Long term (current) use of inhaled steroids: Secondary | ICD-10-CM | POA: Diagnosis not present

## 2018-12-10 DIAGNOSIS — E43 Unspecified severe protein-calorie malnutrition: Secondary | ICD-10-CM | POA: Diagnosis present

## 2018-12-10 DIAGNOSIS — I469 Cardiac arrest, cause unspecified: Secondary | ICD-10-CM | POA: Diagnosis not present

## 2018-12-10 DIAGNOSIS — Z7189 Other specified counseling: Secondary | ICD-10-CM | POA: Diagnosis not present

## 2018-12-10 DIAGNOSIS — C7949 Secondary malignant neoplasm of other parts of nervous system: Secondary | ICD-10-CM | POA: Diagnosis present

## 2018-12-10 LAB — EXPECTORATED SPUTUM ASSESSMENT W REFEX TO RESP CULTURE

## 2018-12-10 LAB — BASIC METABOLIC PANEL
Anion gap: 11 (ref 5–15)
BUN: 24 mg/dL — AB (ref 8–23)
CHLORIDE: 102 mmol/L (ref 98–111)
CO2: 25 mmol/L (ref 22–32)
Calcium: 8.7 mg/dL — ABNORMAL LOW (ref 8.9–10.3)
Creatinine, Ser: 0.62 mg/dL (ref 0.61–1.24)
GFR calc Af Amer: >60 mL/min (ref >60)
GFR calc non Af Amer: >60 mL/min (ref >60)
Glucose, Bld: 103 mg/dL — ABNORMAL HIGH (ref 70–99)
Potassium: 4.2 mmol/L (ref 3.5–5.1)
Sodium: 138 mmol/L (ref 135–145)

## 2018-12-10 LAB — CBC WITH DIFFERENTIAL/PLATELET
Abs Immature Granulocytes: 0.13 10*3/uL — ABNORMAL HIGH (ref 0.00–0.07)
Basophils Absolute: 0.1 10*3/uL (ref 0.0–0.1)
Basophils Relative: 1 %
Eosinophils Absolute: 0.1 10*3/uL (ref 0.0–0.5)
Eosinophils Relative: 1 %
HCT: 41.6 % (ref 39.0–52.0)
Hemoglobin: 13 g/dL (ref 13.0–17.0)
Immature Granulocytes: 1 %
Lymphocytes Relative: 8 %
Lymphs Abs: 1.1 10*3/uL (ref 0.7–4.0)
MCH: 30.9 pg (ref 26.0–34.0)
MCHC: 31.3 g/dL (ref 30.0–36.0)
MCV: 98.8 fL (ref 80.0–100.0)
MONO ABS: 1.6 10*3/uL — AB (ref 0.1–1.0)
Monocytes Relative: 12 %
Neutro Abs: 10.1 10*3/uL — ABNORMAL HIGH (ref 1.7–7.7)
Neutrophils Relative %: 77 %
Platelets: 428 10*3/uL — ABNORMAL HIGH (ref 150–400)
RBC: 4.21 MIL/uL — ABNORMAL LOW (ref 4.22–5.81)
RDW: 13.1 % (ref 11.5–15.5)
WBC: 13.1 10*3/uL — ABNORMAL HIGH (ref 4.0–10.5)
nRBC: 0 % (ref 0.0–0.2)

## 2018-12-10 LAB — HIV ANTIBODY (ROUTINE TESTING W REFLEX): HIV Screen 4th Generation wRfx: NONREACTIVE

## 2018-12-10 LAB — EXPECTORATED SPUTUM ASSESSMENT W GRAM STAIN, RFLX TO RESP C

## 2018-12-10 LAB — MRSA PCR SCREENING: MRSA by PCR: NEGATIVE

## 2018-12-10 MED ORDER — ADULT MULTIVITAMIN W/MINERALS CH
1.0000 | ORAL_TABLET | Freq: Every day | ORAL | Status: DC
  Administered 2018-12-10: 1 via ORAL
  Filled 2018-12-10 (×2): qty 1

## 2018-12-10 MED ORDER — MIRTAZAPINE 15 MG PO TABS
15.0000 mg | ORAL_TABLET | Freq: Every day | ORAL | Status: DC
  Administered 2018-12-10: 15 mg via ORAL
  Filled 2018-12-10: qty 1

## 2018-12-10 MED ORDER — RESOURCE INSTANT PROTEIN PO PWD PACKET
1.0000 | Freq: Three times a day (TID) | ORAL | Status: DC
  Administered 2018-12-10: 6 g via ORAL
  Filled 2018-12-10 (×3): qty 6

## 2018-12-10 MED ORDER — DOXYCYCLINE HYCLATE 100 MG PO TABS
100.0000 mg | ORAL_TABLET | Freq: Two times a day (BID) | ORAL | Status: DC
  Administered 2018-12-10: 100 mg via ORAL
  Filled 2018-12-10: qty 1

## 2018-12-10 MED ORDER — ENSURE ENLIVE PO LIQD: 237.0000 mL | Freq: Three times a day (TID) | ORAL | Status: DC

## 2018-12-10 NOTE — Progress Notes (Signed)
Patient Demographics:    Christopher Mora, is a 68 y.o. male, DOB - Dec 19, 1950, PXT:[id number]  Admit date - 12/09/2018   Admitting Physician Marcell Anger, MD  Outpatient Primary MD for the patient is [name], MD  LOS - 0   Chief Complaint  Patient presents with  . Weakness  . Failure To Thrive        Subjective:    [name] today has no fevers, no emesis,  No chest pain, wife at bedside, continues to cough, continues to be short of breath, continues to have scattered wheezes, patient requesting consult with local oncologist Dr. Earlie Server,  Assessment  & Plan :    Active Problems:   HCAP (healthcare-associated pneumonia)   Adenocarcinoma of lung, stage 4, right ([place])   Metastasis to nervous system and eye (HCC)   Tobacco abuse, in remission   Cachexia [facility])  Brief summary 68 y.o. male with medical history significant for COPD, and recently diagnosed stage IV adenocarcinoma of lung with metastatic disease to right retina who was previously seen at [facility] with biopsy-proven non-small cell lung cancer admitted on 12/09/2018 with respiratory symptoms and clinical exam, lab data and imaging studies consistent with pneumonia  Plan:- 1)HCAP----shortness of breath persist cough persist, WBC 13,  patient wheezes from time to time, treated in the ED with Rocephin and azithromycin, admitting provider prescribed cefepime and vancomycin, MRSA PCR is negative, will de-escalate to cefepime on doxycycline  2) biopsy-proven non-small cell lung cancer with metastatic to the retina--- stage IV discussed with oncologist Dr. Earlie Server,, in basket message as a reminder sent to Dr. Earlie Server for possible inpatient evaluation patient previously evaluated at [facility] patient does not want to go back to [place] he wants to follow-up locally here with Dr. Earlie Server, I made  Dr. Earlie Server aware  3) moderate  protein caloric malnutrition--- over 30 pound weight loss, nutritional consult requested, give Remeron for appetite stimulation and sleep,   4) COPD/reformed smoker----patient recently quit smoking, hold off on steroids at this time, most of patient's respiratory symptoms are secondary to #1 above, dilators, supplemental oxygen as advised  Disposition/Need for in-Hospital Stay- patient unable to be discharged at this time due to healthcare associated pneumonia with significant respiratory symptoms, hypoxia and leukocytosis*  Code Status : Full  Family Communication:   Wife at bedside   Disposition Plan  : TBD (PT eval pending)  Consults  :  Oncology  DVT Prophylaxis  :  Lovenox   Lab Results  Component Value Date   PLT 428 (H) 12/10/2018    Inpatient Medications  Scheduled Meds: . doxycycline  100 mg Oral Q12H  . enoxaparin (LOVENOX) injection  40 mg Subcutaneous Q24H  . fluticasone furoate-vilanterol  1 puff Inhalation Daily  . Melatonin  10 mg Oral QHS  . multivitamin with minerals  1 tablet Oral Daily  . protein supplement  1 scoop Oral TID WC   Continuous Infusions: . sodium chloride Stopped (12/09/18 2249)  . sodium chloride 75 mL/hr at 12/10/18 1255  . azithromycin (ZITHROMAX) 500 MG IVPB (Vial-Mate [employer])    . ceFEPime (MAXIPIME) IV 1 g (12/10/18 1256)   PRN Meds:.sodium chloride, albuterol, guaiFENesin-dextromethorphan, LORazepam    Anti-infectives (From admission, onward)  Start     Dose/Rate Route Frequency Ordered Stop   12/10/18 2200  doxycycline (VIBRA-TABS) tablet 100 mg     100 mg Oral Every 12 hours 12/10/18 1816     12/10/18 1800  vancomycin (VANCOCIN) 1,250 mg in sodium chloride 0.9 % 250 mL IVPB     1,250 mg 166.7 mL/hr over 90 Minutes Intravenous Every 24 hours 12/09/18 1733 12/10/18 1754   12/09/18 2200  ceFEPIme (MAXIPIME) 1 g in sodium chloride 0.9 % 100 mL IVPB     1 g 200 mL/hr over 30 Minutes Intravenous Every 8 hours 12/09/18 1709  12/27/2018 2159   12/09/18 1730  vancomycin (VANCOCIN) 1,250 mg in sodium chloride 0.9 % 250 mL IVPB     1,250 mg 166.7 mL/hr over 90 Minutes Intravenous  Once 12/09/18 1709 12/09/18 2058   12/09/18 1500  cefTRIAXone (ROCEPHIN) 1 g in sodium chloride 0.9 % 100 mL IVPB     1 g 200 mL/hr over 30 Minutes Intravenous  Once 12/09/18 1456 12/09/18 1618   12/09/18 1500  azithromycin (ZITHROMAX) 500 mg in sodium chloride 0.9 % 250 mL IVPB     500 mg 250 mL/hr over 60 Minutes Intravenous  Once 12/09/18 1456          Objective:   Vitals:   12/10/18 0520 12/10/18 0801 12/10/18 1402 12/10/18 1502  BP: (!) 143/87  136/88   Pulse: (!) 108  (!) 108   Resp: 16  18   Temp: 98.5 F (36.9 C)  97.8 F (36.6 C)   TempSrc: Oral  Oral   SpO2: (!) 89% 91% 95% 93%  Weight:      Height:        Wt Readings from Last 3 Encounters:  12/09/18 53.5 kg  11/15/18 59.5 kg  01/03/13 61.8 kg     Intake/Output Summary (Last 24 hours) at 12/10/2018 1924 Last data filed at 12/10/2018 1556 Gross per 24 hour  Intake 2041.37 ml  Output 1075 ml  Net 966.37 ml     Physical Exam Patient is examined daily including today on 12/10/18 , exams remain the same as of yesterday except that has changed   Gen:- Awake Alert, chronically ill-appearing HEENT:- [place].AT, No sclera icterus Nose- [place] 3 L/min Neck-Supple Neck,No JVD,.  Lungs-[place] with scattered rhonchi, scattered wheezes CV- S1, S2 normal, regular  Abd-  +ve B.Sounds, Abd Soft, No tenderness,    Extremity/Skin:- No  edema, pedal pulses present  Psych-affect is appropriate, oriented x3 Neuro-generalized weakness, but no new focal deficits, no tremors   Data Review:   Micro Results Recent Results (from the past 240 hour(s))  Culture, blood (routine x 2) Call MD if unable to obtain prior to antibiotics being given     Status: None (Preliminary result)   Collection Time: 12/09/18  5:10 PM  Result Value Ref Range Status   Specimen Description    Final    BLOOD LEFT FOREARM Performed at [facility] [address] [place], [place] [address]    Special Requests   Final    BOTTLES DRAWN AEROBIC AND ANAEROBIC Blood Culture adequate volume Performed at [facility] [address], [place], [place] [address]    Culture   Final    NO GROWTH < 24 HOURS Performed at [facility] [address], [place], [place] [address]    Report Status PENDING  Incomplete  Culture, blood (routine x 2) Call MD if unable to obtain  prior to antibiotics being given     Status: None (Preliminary result)   Collection Time: 12/09/18  5:36 PM  Result Value Ref Range Status   Specimen Description   Final    BLOOD RIGHT ARM Performed at [facility] [address], [place], [place] [address]    Special Requests   Final    BOTTLES DRAWN AEROBIC AND ANAEROBIC Blood Culture adequate volume Performed at [facility] [address], [place], [place] [address]    Culture   Final    NO GROWTH < 24 HOURS Performed at [facility] [address] [place], [place] [address]    Report Status PENDING  Incomplete  MRSA PCR Screening     Status: None   Collection Time: 12/10/18  1:04 PM  Result Value Ref Range Status   MRSA by PCR NEGATIVE NEGATIVE Final    Comment:        The GeneXpert MRSA Assay (FDA approved for NASAL specimens only), is one component of a comprehensive MRSA colonization surveillance program. It is not intended to diagnose MRSA infection nor to guide or monitor treatment for MRSA infections. Performed at [facility], [place] [address], [place], [place] [address]     Radiology Reports Dg Chest 2 View  Result Date: 12/09/2018 CLINICAL DATA:  Cough and shortness of breath. History of metastatic lung cancer. EXAM: CHEST - 2 VIEW COMPARISON:  Chest x-ray dated November 15, 2018. FINDINGS: The heart size and mediastinal contours  are within normal limits. Normal pulmonary vascularity. Atherosclerotic calcification of the aortic arch. The lungs remain hyperinflated with emphysematous changes. Unchanged right suprahilar fullness. Progressive consolidation in the posterior right upper lobe. No pleural effusion or pneumothorax. No acute osseous abnormality. IMPRESSION: 1. Right upper lobe pneumonia, possibly postobstructive given right suprahilar fullness and history of metastatic lung cancer. 2. COPD. Electronically Signed   By: Titus Dubin M.D.   On: 12/09/2018 11:08   Ct Angio Chest Pe W And/or Wo Contrast  Result Date: 12/09/2018 CLINICAL DATA:  Weakness with choking and coughing when eating. Stage IV lung cancer. EXAM: CT ANGIOGRAPHY CHEST WITH CONTRAST TECHNIQUE: Multidetector CT imaging of the chest was performed using the standard protocol during bolus administration of intravenous contrast. Multiplanar CT image reconstructions and MIPs were obtained to evaluate the vascular anatomy. CONTRAST:  171mL ISOVUE-370 IOPAMIDOL (ISOVUE-370) INJECTION 76% COMPARISON:  Chest CT 11/15/2018. Radiographs 12/09/2018 and 11/15/2018. FINDINGS: Cardiovascular: The pulmonary arteries are well opacified with contrast to the level of the subsegmental branches. There is no evidence of acute pulmonary embolism. There is atherosclerosis of the aorta, great vessels and coronary arteries. The heart size is normal. There is no pericardial effusion. Mediastinum/Nodes: There is confluent soft tissue in the mediastinum and hilar regions, suspicious for persistent lymphadenopathy.Right paratracheal (20 mm on image 39/4) and AP window (18 mm on image 49/4) components are similar to the previous study. Subcarinal component appears worse, measuring 25 mm on image 56/4. No axillary adenopathy. The thyroid gland, trachea and esophagus demonstrate no significant findings. Lungs/Pleura: There is no pleural effusion. Severe centrilobular and paraseptal emphysema  again noted. As seen on recent radiographs, there is consolidation posteriorly in the right upper lobe, most consistent with pneumonia. In addition, there is progressive dependent airspace opacity in both lower lobes. In addition to these probable inflammatory changes, there are several enlarging nodules in the left lung, including a 2.1 x 2.0 cm upper lobe lesion on image 93/10, a  1.9 x 1.3 cm lesion along the inferior medial aspect of the left major fissure (image 145/10) and an 1.1 x 1.5 cm left lower lobe nodule on image 147/10. There are multiple other smaller nodules. A predominately linear 2.2 x 1.0 cm left upper lobe lesion on image 66/10 is stable, probably scarring. No suspicious nodule seen within the aerated portions of the right lung. Upper abdomen: Evaluation of the upper abdomen is limited by paucity of fat. There are bilateral adrenal masses suspicious for metastatic disease. Left adrenal mass measures 3.0 x 2.2 cm on image 114/4. Right adrenal mass is less well-defined and difficult to separate from possible adjacent retroperitoneal adenopathy, measuring up to 4.1 x 3.4 cm on image 113/4. There is a soft tissue nodule posterior to the liver measuring 2.7 x 1.3 cm on image 107/4. No definite hepatic lesions seen. Musculoskeletal/Chest wall: There are multiple lytic osseous lesions consistent with metastatic disease. These are most prominent within the T3 vertebral body (sagittal image 96/7), in the right 5th rib near the costovertebral junction and posteriorly in the right 8th rib. Several other smaller rib metastases are noted. No definite pathologic fracture or epidural tumor in the spine. Review of the MIP images confirms the above findings. IMPRESSION: 1. No evidence of acute pulmonary embolism. 2. Right upper lobe and bibasilar consolidation most consistent with pneumonia. 3. Progressive mediastinal lymphadenopathy, bilateral pulmonary nodularity and osseous metastatic disease consistent with  stage IV lung cancer. In addition, there are bilateral adrenal nodules and increased nodularity in the upper abdomen, also worrisome for progressive metastatic disease. Electronically Signed   By: Richardean Sale M.D.   On: 12/09/2018 13:59   Ct Angio Chest Pe W And/or Wo Contrast  Result Date: 11/15/2018 CLINICAL DATA:  Lung biopsy same day. Short of breath. Concern for pulmonary embolism. EXAM: CT ANGIOGRAPHY CHEST WITH CONTRAST TECHNIQUE: Multidetector CT imaging of the chest was performed using the standard protocol during bolus administration of intravenous contrast. Multiplanar CT image reconstructions and MIPs were obtained to evaluate the vascular anatomy. CONTRAST:  152mL ISOVUE-370 IOPAMIDOL (ISOVUE-370) INJECTION 76% COMPARISON:  Chest radiograph 11/15/2018. FINDINGS: Cardiovascular: No filling defects within the pulmonary arteries to suggest acute pulmonary embolism. No acute findings of the aorta or great vessels. No pericardial fluid. Mediastinum/Nodes: No axillary or supraclavicular adenopathy. Paratracheal adenopathy noted. LEFT lower paratracheal node measures 2.0 cm. RIGHT lower paratracheal node measures 1.7 cm. Subcarinal node and RIGHT hilar nodes are mildly enlarged. Lungs/Pleura: There is perihilar consolidation thickening in the RIGHT suprahilar location. The small amount fluid posteriorly along the anterior margin of the RIGHT oblique fissure (image 83/9). This may relate to recent biopsy. There is no pneumothorax associated with the biopsy. There is extensive centrilobular emphysema. Band of linear thickening at the RIGHT lung base measures up 1.5 cm. There is peripheral nodularity associated with this band of parenchymal thickening within nodule measuring 17 mm (image 161/6). Small nodule in the LEFT lower lobe measures 4 mm (image 151/6). Upper Abdomen: Limited view of the liver, kidneys, pancreas are unremarkable. Normal adrenal glands. Musculoskeletal: No acute osseous abnormality.  Review of the MIP images confirms the above findings. IMPRESSION: 1. No evidence acute pulmonary embolism. 2. No pneumothorax or significant complication following reported lung biopsy same day. 3. Small amount of fluid along the RIGHT fissure may relate to biopsy. 4. Mediastinal lymphadenopathy is concerning for malignancy versus reactive adenopathy. No comparison available. 5. Nodular densities at the RIGHT lung base and band of linear thickening. Recommend correlation with  rationale for lung biopsy which was presumably driven by outside imaging. Electronically Signed   By: Suzy Bouchard M.D.   On: 11/15/2018 17:49   Dg Chest Port 1 View  Result Date: 11/15/2018 CLINICAL DATA:  Shortness of breath, recent lung biopsy EXAM: PORTABLE CHEST 1 VIEW COMPARISON:  01/12/2018 FINDINGS: Hyperinflation with emphysematous disease. Ill-defined opacities within the right upper lobe medially and adjacent to the pulmonary fissure. Heart size within normal limits. Aortic atherosclerosis. No pneumothorax. IMPRESSION: 1. Hyperinflation and emphysematous disease.  No pneumothorax 2. Ill-defined opacities in the right upper lobe medially and adjacent to the pulmonary fissure. Right lower opacity could be secondary to pneumonia, medial right upper lung opacity could be secondary to infiltrate or possible mass given its slightly stellate configuration. These findings are new since comparison radiograph 01/12/2018. Chest CT follow-up as indicated. Electronically Signed   By: Donavan Foil M.D.   On: 11/15/2018 15:02     CBC Recent Labs  Lab 12/09/18 1035 12/09/18 1735 12/10/18 0709  WBC 12.8* 12.9* 13.1*  HGB 14.2 14.4 13.0  HCT 45.1 45.7 41.6  PLT 527* 516* 428*  MCV 95.3 98.7 98.8  MCH 30.0 31.1 30.9  MCHC 31.5 31.5 31.3  RDW 13.0 13.2 13.1  LYMPHSABS 1.0  --  1.1  MONOABS 1.6*  --  1.6*  EOSABS 0.1  --  0.1  BASOSABS 0.0  --  0.1    Chemistries  Recent Labs  Lab 12/09/18 1035 12/09/18 1735  12/10/18 0709  NA 138  --  138  K 4.2  --  4.2  CL 98  --  102  CO2 27  --  25  GLUCOSE 109*  --  103*  BUN 26*  --  24*  CREATININE 0.67 0.87 0.62  CALCIUM 9.4  --  8.7*  AST 37  --   --   ALT 43  --   --   ALKPHOS 88  --   --   BILITOT 0.9  --   --    ------------------------------------------------------------------------------------------------------------------ No results for input(s): CHOL, HDL, LDLCALC, TRIG, CHOLHDL, LDLDIRECT in the last 72 hours.  No results found for: HGBA1C ------------------------------------------------------------------------------------------------------------------ No results for input(s): TSH, T4TOTAL, T3FREE, THYROIDAB in the last 72 hours.  Invalid input(s): FREET3 ------------------------------------------------------------------------------------------------------------------ No results for input(s): VITAMINB12, FOLATE, FERRITIN, TIBC, IRON, RETICCTPCT in the last 72 hours.  Coagulation profile No results for input(s): INR, PROTIME in the last 168 hours.  No results for input(s): DDIMER in the last 72 hours.  Cardiac Enzymes No results for input(s): CKMB, TROPONINI, MYOGLOBIN in the last 168 hours.  Invalid input(s): CK ------------------------------------------------------------------------------------------------------------------    Component Value Date/Time   BNP 127.5 (H) 01/12/2018 1930     Roxan Hockey M.D on 12/10/2018 at 7:24 PM  Go to [email or web address] - for contact info  [facility] - Office  [phone]

## 2018-12-10 NOTE — Evaluation (Signed)
Occupational Therapy Evaluation Patient Details Name: Christopher Mora MRN: [id number] DOB: [birth year] Today's Date: 12/10/2018    History of Present Illness 68 y.o. male with medical history significant of stage IV adenocarcinoma of lung with metastatic disease to right retina who was previously seen at [facility] with biopsy-proven non-small cell lung cancer.  Patient presents to the ER today with shortness of breath, failure to thrive, and increased mucus production. In the ER CT was notable for pneumonia without PE as well as worsening disease.   Clinical Impression   This 68 y/o male presents with the above. At baseline pt is overall independent with ADL and functional mobility, receives supervision for some ADLs due to pt with increased concerns for falling at home. Pt completing short distance mobility in room using RW with minA, one small LOB when backing up to recliner requiring minA to correct. Pt pleasant and willing to work with therapist, but fatigues easily with minimal activity. He currently requires minguard assist for UB ADL, min-modA for LB ADL.  Pt on 3L O2 during session with lowest O2 sat noted 88% after mobility, 90% end of session; HR up to 133 with activity. Pt will benefit from continued acute OT services and recommend follow up [facility] services after discharge to maximize his overall safety and independence with ADL and mobility. Will follow.    Follow Up Recommendations  Home health OT;Supervision/Assistance - 24 hour    Equipment Recommendations  3 in 1 bedside commode(for use in shower)           Precautions / Restrictions Precautions Precautions: Fall Precaution Comments: monitor HR, O2 sats Restrictions Weight Bearing Restrictions: No      Mobility Bed Mobility Overal bed mobility: Needs Assistance Bed Mobility: Supine to Sit     Supine to sit: Supervision;HOB elevated     General bed mobility comments: for general safety, no physical assist  required  Transfers Overall transfer level: Needs assistance Equipment used: Rolling walker (2 wheeled) Transfers: Sit to/from [facility] Sit to Stand: Min assist Stand pivot transfers: Min assist       General transfer comment: assist to rise and steady at RW, VCs hand placement. Pt taking few steps within proximity of recliner prior to return to sitting, one small LOB when backing up to recliner requiring minA to correct    Balance Overall balance assessment: Needs assistance Sitting-balance support: Feet supported Sitting balance-Leahy Scale: Good     Standing balance support: Bilateral upper extremity supported Standing balance-Leahy Scale: Poor                             ADL either performed or assessed with clinical judgement   ADL Overall ADL's : Needs assistance/impaired Eating/Feeding: Set up;Sitting   Grooming: Set up;Sitting   Upper Body Bathing: Min guard;Sitting   Lower Body Bathing: Minimal assistance;Sit to/from stand   Upper Body Dressing : Min guard;Sitting   Lower Body Dressing: Moderate assistance;Sit to/from stand Lower Body Dressing Details (indicate cue type and reason): minA standing balance Toilet Transfer: Minimal assistance;Stand-pivot;BSC;RW Toilet Transfer Details (indicate cue type and reason): simulated via transfer to [place] and Hygiene: Minimal assistance;Sit to/from Nurse, children's Details (indicate cue type and reason): discussed option of using seat in shower for increased safety and for energy conservation Functional mobility during ADLs: Minimal assistance;Rolling walker General ADL Comments: pt with decreased endurance, moving short distance within room (approx  5') prior to transfer to recliner, pt fatigued with activity. One small LOB when backing up to recliner requiring minA to correct; educated pt re: energy conservation techniques with ADL and mobility  tasks     Vision Baseline Vision/History: (reports no vision in R eye) Patient Visual Report: Other (comment)(reports visual loss in R eye)       Perception     Praxis      Pertinent Vitals/Pain Pain Assessment: No/denies pain     Hand Dominance     Extremity/Trunk Assessment Upper Extremity Assessment Upper Extremity Assessment: Generalized weakness   Lower Extremity Assessment Lower Extremity Assessment: Defer to PT evaluation       Communication Communication Communication: No difficulties   Cognition Arousal/Alertness: Awake/alert Behavior During Therapy: WFL for tasks assessed/performed Overall Cognitive Status: Impaired/Different from baseline Area of Impairment: Following commands;Problem solving                       Following Commands: Follows one step commands with increased time     Problem Solving: Difficulty sequencing;Requires verbal cues;Requires tactile cues General Comments: pt with some confusion noted during mobility and required mod cues to follow commands correctly   General Comments  HR up to 133 with brief activity, Pt on 3L O2 with lowest sat noted 88%, 90% end of session after return to sitting    Exercises     Shoulder Instructions      Home Living Family/patient expects to be discharged to:: Private residence Living Arrangements: Spouse/significant other Available Help at Discharge: Family;Available 24 hours/day Type of Home: House Home Access: Stairs to enter [employer] of Steps: 1 (resports half step)   Home Layout: One level     Bathroom Shower/Tub: Tub/shower unit;Walk-in shower   Bathroom Toilet: Handicapped height     Home Equipment: None   Additional Comments: pt on home O2      Prior Functioning/Environment Level of Independence: Independent        Comments: pt's significant other reports she provides supervision for ADL completion due to pt with increased concerns for falling         OT Problem List: Decreased strength;Decreased activity tolerance;Decreased range of motion;Impaired balance (sitting and/or standing);Cardiopulmonary status limiting activity      OT Treatment/Interventions: Therapeutic exercise;Self-care/ADL training;DME and/or AE instruction;Therapeutic activities;Patient/family education;Balance training;Cognitive remediation/compensation;Energy conservation    OT Goals(Current goals can be found in the care plan section) Acute Rehab OT Goals Patient Stated Goal: return home OT Goal Formulation: With patient Time For Goal Achievement: 12/24/18 Potential to Achieve Goals: Good  OT Frequency: Min 2X/week   Barriers to D/C:            Co-evaluation              AM-PAC OT "6 Clicks" Daily Activity     Outcome Measure Help from another person eating meals?: None Help from another person taking care of personal grooming?: A Little Help from another person toileting, which includes using toliet, bedpan, or urinal?: A Little Help from another person bathing (including washing, rinsing, drying)?: A Little Help from another person to put on and taking off regular upper body clothing?: None Help from another person to put on and taking off regular lower body clothing?: A Little 6 Click Score: 20   End of Session Equipment Utilized During Treatment: Gait belt;Rolling walker;Oxygen Nurse Communication: Mobility status  Activity Tolerance: Patient tolerated treatment well;Patient limited by fatigue Patient left: in chair;with call bell/phone  within reach;with family/visitor present  OT Visit Diagnosis: Muscle weakness (generalized) (M62.81)                Time: 9798-9211 OT Time Calculation (min): 26 min Charges:  OT General Charges $OT Visit: 1 Visit OT Evaluation $OT Eval Moderate Complexity: 1 Mod OT Treatments $Self Care/Home Management : 8-22 mins  Lou Cal, OT Supplemental Rehabilitation Services Pager [phone] Office  [phone]   Raymondo Band 12/10/2018, 9:45 AM

## 2018-12-10 NOTE — Plan of Care (Signed)
  Problem: Education: Goal: Knowledge of General Education information will improve Description Including pain rating scale, medication(s)/side effects and non-pharmacologic comfort measures Outcome: Progressing   Problem: Coping: Goal: Level of anxiety will decrease Outcome: Progressing   Problem: Elimination: Goal: Will not experience complications related to urinary retention Outcome: Progressing   Problem: Pain Managment: Goal: General experience of comfort will improve Outcome: Progressing   Problem: Safety: Goal: Ability to remain free from injury will improve Outcome: Progressing

## 2018-12-10 NOTE — Progress Notes (Signed)
PT Cancellation Note  Patient Details Name: Christopher Mora MRN: [id number] DOB: 1951-01-15   Cancelled Treatment:    Reason Eval/Treat Not Completed: Attempted PT eval. Pt declined participation at this time. He was awaiting a respiratory tx and he did not wish to mobilize before receiving it. Will check back another day.    Weston Anna, PT Acute Rehabilitation Services Pager: [phone] Office: [phone]

## 2018-12-10 NOTE — Progress Notes (Signed)
Initial Nutrition Assessment  DOCUMENTATION CODES:   Severe malnutrition in context of acute illness/injury, Underweight  INTERVENTION:  - Will d/c Ensure Enlive per patient request. - Will order daily multivitamin with minerals. - Continue to encourage PO intakes.  - Will order Beneprotein to be added to patient's foods; each packet/scoop provides 25 kcal and 6 grams of protein.    NUTRITION DIAGNOSIS:   Severe Malnutrition related to acute illness, cancer and cancer related treatments as evidenced by moderate fat depletion, moderate muscle depletion, percent weight loss.  GOAL:   Patient will meet greater than or equal to 90% of their needs  MONITOR:   PO intake, Weight trends, Labs  REASON FOR ASSESSMENT:   Malnutrition Screening Tool, Consult Assessment of nutrition requirement/status  ASSESSMENT:   68 y.o. male with medical history significant of asthma, COPD, tobacco dependence (last used in 08/2018), stage IV adenocarcinoma of lung with metastatic disease to R retina. He was previously seen at [facility] with biopsy-proven NSCLC.  Patient presented to the ED with SOB, FTT, and increased mucus production. He endorsed mucus production, SOB, decreased strength, general malaise, and approximately 30 pound weight loss in 2 months. In the ED CT was notable for PNA without PE.  Patient reports eating scrambled eggs for breakfast this AM. He has a large cup of Propel water at bedside. He accepts RD offer to order lunch--only wanted macaroni and cheese.   Patient reports that he was doing well with eating until ~1 month ago. All of a sudden, he experienced early satiety (after a few bites) and had difficulty swallowing. They started at the same time and neither has worsened over time. He reports feeling like items get stuck in his throat even if he chews them into very small pieces. He does best with thin liquids and states that thicker liquids are difficult for him to swallow.   He  states that oral nutrition supplements are too difficult for him to swallow d/t thickness. Talked with patient about Boost Breeze but he declines as he has tried it in the past and did not like the flavor. Will trial Beneprotein for him to add to foods.   Patient reports that it is also difficult for him to chew items unless they are soft or are cut into small pieces. He denies any abdominal pain or nausea with or without eating.   Patient reports UBW of 130-140 lb and reports he last weighed this 2 months ago. Per chart review, current weight is 118 lb and weight on 11/15/18 was 131 lb indicating 10% wt loss in 1 months.   He states that he has not yet started treatment for lung cancer but confirms he plans to receive treatment at [facility].   Medications reviewed; 10 mg melatonin at bedtime.  Labs reviewed; BUN: 24 mg/dL, Ca; 8.7 mg/dL. IVF; NS @ 75 mL/hr.     NUTRITION - FOCUSED PHYSICAL EXAM:    Most Recent Value  Orbital Region  Mild depletion  Upper Arm Region  Moderate depletion  Thoracic and Lumbar Region  Unable to assess  Buccal Region  Moderate depletion  Temple Region  Moderate depletion  Clavicle Bone Region  Moderate depletion  Clavicle and Acromion Bone Region  Moderate depletion  Scapular Bone Region  Unable to assess  Dorsal Hand  Mild depletion  Patellar Region  Mild depletion  Anterior Thigh Region  Unable to assess  Posterior Calf Region  Moderate depletion  Edema (RD Assessment)  None  Hair  Reviewed  Eyes  Reviewed  Mouth  Reviewed  Skin  Reviewed  Nails  Reviewed       Diet Order:   Diet Order            Diet regular Room service appropriate? Yes; Fluid consistency: Thin  Diet effective now              EDUCATION NEEDS:   Not appropriate for education at this time  Skin:  Skin Assessment: Reviewed RN Assessment  Last BM:  PTA/unknown  Height:   Ht Readings from Last 1 Encounters:  12/09/18 5\' 8"  (1.727 m)    Weight:   Wt Readings  from Last 1 Encounters:  12/09/18 53.5 kg    Ideal Body Weight:  70 kg  BMI:  Body mass index is 17.94 kg/m.  Estimated Nutritional Needs:   Kcal:  1875-2140 kcal (35-40 kcal/kg)  Protein:  80-90 grams (1.5-1.7 grams/kg)  Fluid:  >/= 1.8 L/day     Jarome Matin, MS, RD, LDN, [facility] Inpatient Clinical Dietitian Pager # [phone] After hours/weekend pager # [phone]

## 2018-12-11 ENCOUNTER — Inpatient Hospital Stay (HOSPITAL_COMMUNITY): Payer: Medicare HMO | Admitting: Anesthesiology

## 2018-12-11 ENCOUNTER — Inpatient Hospital Stay (HOSPITAL_COMMUNITY): Payer: Medicare HMO

## 2018-12-11 DIAGNOSIS — C3491 Malignant neoplasm of unspecified part of right bronchus or lung: Secondary | ICD-10-CM

## 2018-12-11 DIAGNOSIS — J9601 Acute respiratory failure with hypoxia: Secondary | ICD-10-CM | POA: Insufficient documentation

## 2018-12-11 DIAGNOSIS — J189 Pneumonia, unspecified organism: Secondary | ICD-10-CM | POA: Insufficient documentation

## 2018-12-11 DIAGNOSIS — J181 Lobar pneumonia, unspecified organism: Secondary | ICD-10-CM

## 2018-12-11 DIAGNOSIS — E43 Unspecified severe protein-calorie malnutrition: Secondary | ICD-10-CM

## 2018-12-11 DIAGNOSIS — R0603 Acute respiratory distress: Secondary | ICD-10-CM

## 2018-12-11 DIAGNOSIS — C3490 Malignant neoplasm of unspecified part of unspecified bronchus or lung: Secondary | ICD-10-CM

## 2018-12-11 LAB — BLOOD GAS, ARTERIAL
Acid-base deficit: 6 mmol/L — ABNORMAL HIGH (ref 0.0–2.0)
Acid-base deficit: 5.1 mmol/L — ABNORMAL HIGH (ref 0.0–2.0)
Bicarbonate: 22.3 mmol/L (ref 20.0–28.0)
Bicarbonate: 22.2 mmol/L (ref 20.0–28.0)
Drawn by: 331471
Drawn by: 331471
FIO2: 100
FIO2: 50
MECHVT: 540 mL
MECHVT: 540 mL
O2 Saturation: 98.8 %
O2 Saturation: 94.7 %
PEEP: 5 cmH2O
PEEP: 5 cmH2O
PO2 ART: 89.7 mmHg (ref 83.0–108.0)
Patient temperature: 98.6
Patient temperature: 98.6
RATE: 14 resp/min
RATE: 18 resp/min
pCO2 arterial: 60 mmHg — ABNORMAL HIGH (ref 32.0–48.0)
pCO2 arterial: 52.8 mmHg — ABNORMAL HIGH (ref 32.0–48.0)
pH, Arterial: 7.196 — CL (ref 7.350–7.450)
pH, Arterial: 7.246 — ABNORMAL LOW (ref 7.350–7.450)
pO2, Arterial: 254 mmHg — ABNORMAL HIGH (ref 83.0–108.0)

## 2018-12-11 LAB — COMPREHENSIVE METABOLIC PANEL
ALBUMIN: 2.6 g/dL — AB (ref 3.5–5.0)
ALT: 30 U/L (ref 0–44)
AST: 44 U/L — ABNORMAL HIGH (ref 15–41)
Alkaline Phosphatase: 78 U/L (ref 38–126)
Anion gap: 11 (ref 5–15)
BUN: 27 mg/dL — ABNORMAL HIGH (ref 8–23)
CALCIUM: 8.9 mg/dL (ref 8.9–10.3)
CO2: 24 mmol/L (ref 22–32)
Chloride: 104 mmol/L (ref 98–111)
Creatinine, Ser: 0.87 mg/dL (ref 0.61–1.24)
GFR calc Af Amer: >60 mL/min (ref >60)
GFR calc non Af Amer: >60 mL/min (ref >60)
Glucose, Bld: 110 mg/dL — ABNORMAL HIGH (ref 70–99)
Potassium: 4.5 mmol/L (ref 3.5–5.1)
Sodium: 139 mmol/L (ref 135–145)
Total Bilirubin: 0.9 mg/dL (ref 0.3–1.2)
Total Protein: 6.1 g/dL — ABNORMAL LOW (ref 6.5–8.1)

## 2018-12-11 LAB — GLUCOSE, CAPILLARY
Glucose-Capillary: 118 mg/dL — ABNORMAL HIGH (ref 70–99)
Glucose-Capillary: 136 mg/dL — ABNORMAL HIGH (ref 70–99)
Glucose-Capillary: 85 mg/dL (ref 70–99)

## 2018-12-11 LAB — CBC
HCT: 45.2 % (ref 39.0–52.0)
Hemoglobin: 13.6 g/dL (ref 13.0–17.0)
MCH: 30.4 pg (ref 26.0–34.0)
MCHC: 30.1 g/dL (ref 30.0–36.0)
MCV: 100.9 fL — ABNORMAL HIGH (ref 80.0–100.0)
Platelets: 502 10*3/uL — ABNORMAL HIGH (ref 150–400)
RBC: 4.48 MIL/uL (ref 4.22–5.81)
RDW: 13.1 % (ref 11.5–15.5)
WBC: 22.2 10*3/uL — ABNORMAL HIGH (ref 4.0–10.5)
nRBC: 0 % (ref 0.0–0.2)

## 2018-12-11 LAB — TROPONIN I
Troponin I: 0.05 ng/mL (ref <0.03)
Troponin I: 0.04 ng/mL (ref <0.03)

## 2018-12-11 LAB — APTT: APTT: 27 s (ref 24–36)

## 2018-12-11 LAB — LACTIC ACID, PLASMA
LACTIC ACID, VENOUS: 3.1 mmol/L — AB (ref 0.5–1.9)
Lactic Acid, Venous: 3.5 mmol/L (ref 0.5–1.9)

## 2018-12-11 LAB — PROTIME-INR
INR: 1.27
Prothrombin Time: 15.8 seconds — ABNORMAL HIGH (ref 11.4–15.2)

## 2018-12-11 LAB — HEPATIC FUNCTION PANEL
ALK PHOS: 77 U/L (ref 38–126)
ALT: 29 U/L (ref 0–44)
AST: 43 U/L — ABNORMAL HIGH (ref 15–41)
Albumin: 2.6 g/dL — ABNORMAL LOW (ref 3.5–5.0)
Bilirubin, Direct: 0.2 mg/dL (ref 0.0–0.2)
Indirect Bilirubin: 0.6 mg/dL (ref 0.3–0.9)
Total Bilirubin: 0.8 mg/dL (ref 0.3–1.2)
Total Protein: 6.1 g/dL — ABNORMAL LOW (ref 6.5–8.1)

## 2018-12-11 LAB — PHOSPHORUS: Phosphorus: 5.5 mg/dL — ABNORMAL HIGH (ref 2.5–4.6)

## 2018-12-11 LAB — STREP PNEUMONIAE URINARY ANTIGEN: Strep Pneumo Urinary Antigen: NEGATIVE

## 2018-12-11 LAB — MAGNESIUM: Magnesium: 2.2 mg/dL (ref 1.7–2.4)

## 2018-12-11 MED ORDER — LEVALBUTEROL HCL 0.63 MG/3ML IN NEBU
0.6300 mg | INHALATION_SOLUTION | Freq: Four times a day (QID) | RESPIRATORY_TRACT | Status: DC | PRN
  Administered 2018-12-11 – 2018-12-15 (×7): 0.63 mg via RESPIRATORY_TRACT
  Filled 2018-12-11 (×7): qty 3

## 2018-12-11 MED ORDER — ADULT MULTIVITAMIN W/MINERALS CH: 1.0000 | ORAL_TABLET | Freq: Every day | ORAL | Status: DC

## 2018-12-11 MED ORDER — RANITIDINE HCL 150 MG/10ML PO SYRP
150.0000 mg | ORAL_SOLUTION | Freq: Two times a day (BID) | ORAL | Status: DC
  Administered 2018-12-11 – 2018-12-12 (×2): 150 mg
  Filled 2018-12-11 (×3): qty 10

## 2018-12-11 MED ORDER — ORAL CARE MOUTH RINSE
15.0000 mL | OROMUCOSAL | Status: DC
  Administered 2018-12-11 – 2018-12-12 (×10): 15 mL via OROMUCOSAL

## 2018-12-11 MED ORDER — FAMOTIDINE 40 MG/5ML PO SUSR: 20.0000 mg | Freq: Two times a day (BID) | ORAL | Status: DC

## 2018-12-11 MED ORDER — ADULT MULTIVITAMIN LIQUID CH
15.0000 mL | Freq: Every day | ORAL | Status: DC
  Administered 2018-12-12: 15 mL
  Filled 2018-12-11: qty 15

## 2018-12-11 MED ORDER — FENTANYL CITRATE (PF) 100 MCG/2ML IJ SOLN
50.0000 ug | INTRAMUSCULAR | Status: DC | PRN
  Administered 2018-12-11: 50 ug via INTRAVENOUS

## 2018-12-11 MED ORDER — MIDAZOLAM HCL 2 MG/2ML IJ SOLN
2.0000 mg | Freq: Once | INTRAMUSCULAR | Status: DC
  Filled 2018-12-11: qty 2

## 2018-12-11 MED ORDER — FENTANYL CITRATE (PF) 100 MCG/2ML IJ SOLN
50.0000 ug | INTRAMUSCULAR | Status: DC | PRN
  Administered 2018-12-11: 50 ug via INTRAVENOUS
  Filled 2018-12-11: qty 2

## 2018-12-11 MED ORDER — METOPROLOL TARTRATE 25 MG PO TABS: 25.0000 mg | ORAL_TABLET | Freq: Two times a day (BID) | ORAL | Status: DC

## 2018-12-11 MED ORDER — MIDAZOLAM HCL 2 MG/2ML IJ SOLN
INTRAMUSCULAR | Status: AC
  Filled 2018-12-11: qty 2

## 2018-12-11 MED ORDER — LEVALBUTEROL HCL 0.63 MG/3ML IN NEBU
INHALATION_SOLUTION | RESPIRATORY_TRACT | Status: AC
  Filled 2018-12-11: qty 3

## 2018-12-11 MED ORDER — METHYLPREDNISOLONE SODIUM SUCC 125 MG IJ SOLR
60.0000 mg | Freq: Two times a day (BID) | INTRAMUSCULAR | Status: DC
  Administered 2018-12-12: 60 mg via INTRAVENOUS
  Filled 2018-12-11: qty 2

## 2018-12-11 MED ORDER — FENTANYL CITRATE (PF) 100 MCG/2ML IJ SOLN
INTRAMUSCULAR | Status: AC
  Filled 2018-12-11: qty 2

## 2018-12-11 MED ORDER — FAMOTIDINE 40 MG/5ML PO SUSR
20.0000 mg | Freq: Two times a day (BID) | ORAL | Status: DC
  Filled 2018-12-11: qty 2.5

## 2018-12-11 MED ORDER — CHLORHEXIDINE GLUCONATE 0.12% ORAL RINSE (MEDLINE KIT)
15.0000 mL | Freq: Two times a day (BID) | OROMUCOSAL | Status: DC
  Administered 2018-12-11 – 2018-12-12 (×2): 15 mL via OROMUCOSAL

## 2018-12-11 MED ORDER — MIDAZOLAM HCL 2 MG/2ML IJ SOLN: 1.0000 mg | INTRAMUSCULAR | Status: DC | PRN

## 2018-12-11 MED ORDER — MELATONIN 5 MG PO TABS
10.0000 mg | ORAL_TABLET | Freq: Every day | ORAL | Status: DC
  Administered 2018-12-11: 10 mg
  Filled 2018-12-11 (×2): qty 2

## 2018-12-11 MED ORDER — METHYLPREDNISOLONE SODIUM SUCC 125 MG IJ SOLR
60.0000 mg | Freq: Four times a day (QID) | INTRAMUSCULAR | Status: DC
  Administered 2018-12-11: 60 mg via INTRAVENOUS
  Filled 2018-12-11: qty 2

## 2018-12-11 MED ORDER — VITAL HIGH PROTEIN PO LIQD: 1000.0000 mL | ORAL | Status: DC

## 2018-12-11 MED ORDER — FENTANYL CITRATE (PF) 100 MCG/2ML IJ SOLN
50.0000 ug | Freq: Once | INTRAMUSCULAR | Status: AC
  Administered 2018-12-11: 50 ug via INTRAVENOUS
  Filled 2018-12-11: qty 2

## 2018-12-11 MED ORDER — FENTANYL 2500MCG IN NS 250ML (10MCG/ML) PREMIX INFUSION
0.0000 ug/h | INTRAVENOUS | Status: DC
  Administered 2018-12-11: 25 ug/h via INTRAVENOUS
  Filled 2018-12-11: qty 250

## 2018-12-11 MED ORDER — SODIUM CHLORIDE 0.9 % IV BOLUS
500.0000 mL | Freq: Once | INTRAVENOUS | Status: AC
  Administered 2018-12-11: 500 mL via INTRAVENOUS

## 2018-12-11 MED ORDER — FENTANYL BOLUS VIA INFUSION
20.0000 ug | INTRAVENOUS | Status: DC | PRN
  Filled 2018-12-11: qty 20

## 2018-12-11 MED ORDER — MIRTAZAPINE 15 MG PO TABS
15.0000 mg | ORAL_TABLET | Freq: Every day | ORAL | Status: DC
  Administered 2018-12-11: 15 mg
  Filled 2018-12-11: qty 1

## 2018-12-11 MED ORDER — BUDESONIDE 0.5 MG/2ML IN SUSP
0.5000 mg | Freq: Two times a day (BID) | RESPIRATORY_TRACT | Status: DC
  Administered 2018-12-11 – 2018-12-16 (×11): 0.5 mg via RESPIRATORY_TRACT
  Filled 2018-12-11 (×10): qty 2

## 2018-12-11 MED ORDER — MIDAZOLAM HCL 2 MG/2ML IJ SOLN
1.0000 mg | INTRAMUSCULAR | Status: DC | PRN
  Administered 2018-12-11 (×2): 1 mg via INTRAVENOUS
  Filled 2018-12-11: qty 2

## 2018-12-11 MED ORDER — HEPARIN SODIUM (PORCINE) 5000 UNIT/ML IJ SOLN
5000.0000 [IU] | Freq: Three times a day (TID) | INTRAMUSCULAR | Status: DC
  Administered 2018-12-11 – 2018-12-14 (×9): 5000 [IU] via SUBCUTANEOUS
  Filled 2018-12-11 (×9): qty 1

## 2018-12-11 MED ORDER — HYDROCOD POLST-CPM POLST ER 10-8 MG/5ML PO SUER
5.0000 mL | Freq: Once | ORAL | Status: AC
  Administered 2018-12-11: 5 mL via ORAL
  Filled 2018-12-11: qty 5

## 2018-12-11 MED ORDER — LEVALBUTEROL HCL 1.25 MG/0.5ML IN NEBU: 1.2500 mg | INHALATION_SOLUTION | Freq: Once | RESPIRATORY_TRACT | Status: AC

## 2018-12-11 MED ORDER — VITAL AF 1.2 CAL PO LIQD
1000.0000 mL | ORAL | Status: DC
  Administered 2018-12-11: 1000 mL

## 2018-12-11 MED ORDER — ARFORMOTEROL TARTRATE 15 MCG/2ML IN NEBU
15.0000 ug | INHALATION_SOLUTION | Freq: Two times a day (BID) | RESPIRATORY_TRACT | Status: DC
  Administered 2018-12-11 – 2018-12-16 (×11): 15 ug via RESPIRATORY_TRACT
  Filled 2018-12-11 (×10): qty 2

## 2018-12-11 MED ORDER — ONDANSETRON HCL 4 MG/2ML IJ SOLN
4.0000 mg | Freq: Four times a day (QID) | INTRAMUSCULAR | Status: DC | PRN
  Administered 2018-12-11: 4 mg via INTRAVENOUS
  Filled 2018-12-11: qty 2

## 2018-12-11 MED ORDER — DOCUSATE SODIUM 50 MG/5ML PO LIQD: 100.0000 mg | Freq: Two times a day (BID) | ORAL | Status: DC | PRN

## 2018-12-11 MED FILL — Medication: Qty: 1 | Status: AC

## 2018-12-11 NOTE — Progress Notes (Signed)
Nutrition Follow-up  DOCUMENTATION CODES:   Severe malnutrition in context of acute illness/injury, Underweight  INTERVENTION:  - Will change TF to Vital 1.2 @ 20 ml/hr which will provide 576 kcal, 36 grams of protein, and 389 ml free water. - Goal for TF: Vital AF 1.2 @ 55 ml/hr to provide 1584 kcal, 99 grams of protein, and 1070 mL free water.  Monitor magnesium, potassium, and phosphorus daily for at least 3 days, MD to replete as needed, as pt is at risk for refeeding syndrome given severe malnutrition, poor PO intakes PTA and 1/14.   NUTRITION DIAGNOSIS:   Severe Malnutrition related to acute illness, cancer and cancer related treatments as evidenced by moderate fat depletion, moderate muscle depletion, percent weight loss. -ongoing  GOAL:   Patient will meet greater than or equal to 90% of their needs -unmet at this time  MONITOR:   Vent status, TF tolerance, Weight trends, Labs  REASON FOR ASSESSMENT:   Ventilator, Consult Enteral/tube feeding initiation and management  ASSESSMENT:   68 y.o. male with medical history significant of asthma, COPD, tobacco dependence (last used in 08/2018), stage IV adenocarcinoma of lung with metastatic disease to R retina. He was previously seen at [facility] with biopsy-proven NSCLC.  Patient presented to the ED with SOB, FTT, and increased mucus production. He endorsed mucus production, SOB, decreased strength, general malaise, and approximately 30 pound weight loss in 2 months. In the ED CT was notable for PNA without PE.  Code Blue late AM today. Patient now intubated with OGT in place. Abdominal x-ray obtained but results not in yet. Family at bedside at this time.   Per Brandi's note: acute hypercarbic/hypoxic respiratory failure leading to cardiac arrest, RUL PNA, acute encephalopathy, leukocytosis, FTT, trickle TF and if tolerating then consider advancement 1/16.   Patient is currently intubated on ventilator support MV: 9.5  L/min Temp (24hrs), Avg:98.5 F (36.9 C), Min:98 F (36.7 C), Max:99.1 F (37.3 C) Propofol: none BP: 135/96 and MAP: 107   Medications reviewed; 60 mg solu-medrol BID, 15 ml liquid multivitamin per OGT/day, 150 mg zantac per OGT BID. Labs reviewed; BUN: 27 mg/dl, Phos: 5.5 mg/dl. IVF; NS @ 50 mL/hr. Drip; fentanyl @ 25 mcg/hr.     Diet Order:   Diet Order            Diet NPO time specified  Diet effective now              EDUCATION NEEDS:   Not appropriate for education at this time  Skin:  Skin Assessment: Reviewed RN Assessment  Last BM:  PTA/unknown  Height:   Ht Readings from Last 1 Encounters:  12/09/18 5\' 8"  (1.727 m)    Weight:   Wt Readings from Last 1 Encounters:  12/09/18 53.5 kg    Ideal Body Weight:  70 kg  BMI:  Body mass index is 17.94 kg/m.  Estimated Nutritional Needs:   Kcal:  1549 kcal  Protein:  80-90 grams (1.5-1.7 grams/kg)  Fluid:  >/= 1.8 L/day     Jarome Matin, MS, RD, LDN, [facility] Inpatient Clinical Dietitian Pager # [phone] After hours/weekend pager # [phone]

## 2018-12-11 NOTE — Progress Notes (Signed)
MD Byrum made aware of Lactic Acid 3.1. Byrum will relay message to MD, Loanne Drilling.

## 2018-12-11 NOTE — ED Provider Notes (Signed)
Called to see patient on the floor secondary to CODE BLUE activation.  Upon arrival, patient being intubated by CRNA's.  Compressions have been started.  Patient had ACLS protocol performed prior to this.  I instructed the nurse to stop compressions and patient was in V. fib.  Patient cardioverted with 120 J and into sinus rhythm.  First intubation attempt by the CRNA's was unsuccessful and was recognized quickly.  They reintubated the patient with the use of a glide scope and placement confirmed with end-tidal CO2 detector.  Patient's blood pressure was 140/70 after receiving IV fluids was ordered by me.  Care turned over to the hospitalist team.   Lacretia Leigh, MD 12/11/18 [phone]

## 2018-12-11 NOTE — Progress Notes (Signed)
Patient HR sustaining in 140s, upon assessment patient had vomited and was sitting on edge of bed. Oxygen saturations were in the 80s and patient had increased work of breathing. Patient speech more congested sounding. Lung sound rhonchus and wheezy. Respiratory therapist and rapid response nurse called. Christopher Ip Schorr NP made aware.  Placed on non-breather, chest xray obtained, and order for NTS received. Respiratory therapist performed NTS.  Pt appeared more comfortable and weaned down to High flow nasal cannula. Currently on 10L, sating in mid 90's. Patient has no complaints at this time. Will continue with plan of care.

## 2018-12-11 NOTE — Progress Notes (Signed)
eLink Physician-Brief Progress Note Patient Name: Christopher Mora DOB: 01-Dec-1950 MRN: [id number]   Date of Service  12/11/2018  HPI/Events of Note  Needs Foley catheter. Has condom catheter but no urine output and residual on bladder scanning > 300 ml  eICU Interventions  Order entered for Foley catheter        Frederik Pear 12/11/2018, 11:36 PM

## 2018-12-11 NOTE — Plan of Care (Signed)
  Problem: Nutrition: Goal: Adequate nutrition will be maintained Outcome: Not Progressing Patient continues with poor appetite, declines ensure and protein powder - milky consistencies do not go well due to resp secretions.  Offered boost breeze drinks as well - patient does not like them.  Will continue to encourage PO intake.  Wife brought propel packets to mix in water which patient enjoys   Problem: Pain Managment: Goal: General experience of comfort will improve Outcome: Completed/Met  Patient denies pain

## 2018-12-11 NOTE — Significant Event (Signed)
Rapid Response Event Note  Overview: Time Called: 0222 Arrival Time: 0225 Event Type: Respiratory  Called to bedside for patient having increased work of breathing, HR 130s bpm, diaphoretic, congested speech.   Initial Focused Assessment: Upon my assessment, patient is sitting up in chair. Appears to have increased work of breathing. Has congested cough and is sometimes able to produce sputum. Sputum is thick, tenacious, pale yellow. Lung sound rhonchus, coarse, and wheezing heard in a lung fields. Oxygen saturation 84% on 5LNC.  Interventions: Lung sounds did not improve following breathing treatment. CXR completed and reported to NP. Order received for NTS.  Recovered with NRB mask 15L, then progressively weaned down to 8L HFNC by respiratory therapist.  Plan of Care (if not transferred): RN to encourage patient to use flutter valve. Order received for continuous pulse oximetry monitoring. Order in place for NTS as needed. RN to call rapid response for further needs.   Event Summary: Upon my departure, patient oxygen saturation 95% on 8L HFNC. Patient states he does not feel short of breath or feel he has an increased work of breathing. Flutter valve at bedside. Patient still has congested cough and speech, patient encouraged to cough up secretions. HR 125 bpm. Lamar Blinks, NP at bedside to assess the patient.   Casimer Bilis

## 2018-12-11 NOTE — Consult Note (Signed)
NAME:  Christopher Mora, MRN:  [id number], DOB:  Sep 05, 1951, LOS: 1 ADMISSION DATE:  12/09/2018, CONSULTATION DATE:  12/11/18 REFERRING MD:  Dr. Zigmund Daniel / [facility] , CHIEF COMPLAINT:  Respiratory Arrest    Brief History   68 y/o M admitted on 1/13 with complaints of SOB.  Work up concerning for PNA.  CTA chest negative for PE on admit.  Early am 1/15, rapid response was called to evaluate for respiratory distress which improved with O2 & NTS. He developed increased SOB later afternoon 1/15 and suffered a respiratory leading to cardiac arrest.  Intubated per EDP and transferred to ICU.  History of present illness   68 y/o M admitted on 1/13 with complaints of SOB.  Work up concerning for PNA.  CTA chest negative for PE on admit but did show concern for PNA.  He was treated with IV antibiotics (Cefepime + Vancomycin).  Early am 1/15, rapid response was called to evaluate for respiratory distress which improved with O2 & NTS. He was noted to have elevated HR to 140's post event but improved to 100-110's.   Family reported around lunch that he had sudden SOB with increased WOB.  RT called to bedside and reported she found him agonal in the chair.  BVM utilized and patient transferred to bed.  Code Blue called and he required ~ 6-9 minutes of CPR, 1 shock and intubation per EDP.  Attempt at intubation per CRNA but unable.  Transferred to ICU for further care.     ABG post arrest 7.19 / 60 / 254 / 22.    Past Medical History  Stage IV Adenocarcinoma of the Lung with Metastasis to the retina.    Significant Hospital Events   1/13  Admit with SOB, concern for PNA 1/15  Respiratory leading to cardiac arrest (~ 6-9 min CPR before ROSC)  Consults:  PCCM   Procedures:  ETT 1/15 >>   Significant Diagnostic Tests:  CTA Chest 1/13 >>   Micro Data:  Sputum 1/13 >>  BCx2 1/13 >>  U. Strep Antigen 1/13 >> negative   Antimicrobials:  Vanco 1/13 >>  Cefepime 1/13 >>   Interim history/subjective:  RN  reports pt on vent, given 50 mcg fentanyl, 1mg  versed.  VSS.  Waking/following commands.  Objective   Blood pressure 131/77, pulse (!) 120, temperature 98.5 F (36.9 C), temperature source Oral, resp. rate 20, height 5\' 8"  (1.727 m), weight 53.5 kg, SpO2 95 %.        Intake/Output Summary (Last 24 hours) at 12/11/2018 1206 Last data filed at 12/11/2018 1941 Gross per 24 hour  Intake 2273.92 ml  Output 425 ml  Net 1848.92 ml   Filed Weights   12/09/18 1032  Weight: 53.5 kg    Examination: General: thin adult male lying in bed on vent HEENT: MM pink/moist, ETT, rounded palpable irregular mass on right posterior head Neuro: initial exam post arrest > would open eyes but no follow commands.  Reassessment > pt waking / following simple commands CV: s1s2 rrr, no m/r/g PULM: even/non-labored, lungs bilaterally with wheezing, rhonchi  DE:YCXK, non-tender, bsx4 hypoactive  Extremities: warm/dry, no edema  Skin: no rashes or lesions  Resolved Hospital Problem list      Assessment & Plan:   Respiratory leading to Cardiac Arrest  -suspect secretion clearance, hypercarbia, overall muscle weakness / fatigue as contributing factors & possible sedating effects from medications P: ICU monitoring  Repeat labs now, CXR & ABG post arrest  Acute Hypercarbic / Hypoxic Respiratory Failure  P:  PRVC 8cc/kg, rate 18  Wean PEEP / FiO2 for sats 88-95% Follow intermittent CXR Adjust ETT 1cm into body  May be difficult to wean due to underlying COPD   RUL PNA  P: Continue cefepime, vancomycin as ordered Follow CXR   Stage IV Adenocarcinoma with Metastasis to Retina  P: Oncology requested by [facility] prior to transfer to ICU   COPD P: Add brovana + pulmicort  PRN xopenex  Solumedrol 60 mg IV Q12 Hold home advair   Acute Encephalopathy  -suspect multifactorial in setting of hypercarbia, post arrest, potential hypoxia.  R/O anoxic contribution  P:  Exam improving post arrest Frequent  re-orientation, promote sleep/wake cycle  Minimize sedating medications as able  PRN versed / fentanyl for RASS Goal: 0 to -1  Leukocytosis  -suspect multifactorial in setting of PNA, steroids, post arrest P: Follow fever curve / WBC trend   Failure to Thrive  P: Discussed goals of care with son and ex-wife at bedside.  Pt expressed on admit that he "wants everything done" Add trickle TF given weight loss prior to admit, if tolerates consider advancing 1/16  Best practice:  Diet: NPO, TF Pain/Anxiety/Delirium protocol (if indicated): PRN versed, fentanyl  VAP protocol (if indicated): in place  DVT prophylaxis: heparin  GI prophylaxis: pepcid  Glucose control: n/a  Mobility: bed rest  Code Status: Full Code  Family Communication: Son, ex-wife updated at bedside  Disposition: ICU   Labs   CBC: Recent Labs  Lab 12/09/18 1035 12/09/18 1735 12/10/18 0709  WBC 12.8* 12.9* 13.1*  NEUTROABS 10.0*  --  10.1*  HGB 14.2 14.4 13.0  HCT 45.1 45.7 41.6  MCV 95.3 98.7 98.8  PLT 527* 516* 428*    Basic Metabolic Panel: Recent Labs  Lab 12/09/18 1035 12/09/18 1735 12/10/18 0709  NA 138  --  138  K 4.2  --  4.2  CL 98  --  102  CO2 27  --  25  GLUCOSE 109*  --  103*  BUN 26*  --  24*  CREATININE 0.67 0.87 0.62  CALCIUM 9.4  --  8.7*   GFR: Estimated Creatinine Clearance: 67.8 mL/min (by C-G formula based on SCr of 0.62 mg/dL). Recent Labs  Lab 12/09/18 1035 12/09/18 1735 12/10/18 0709  WBC 12.8* 12.9* 13.1*    Liver Function Tests: Recent Labs  Lab 12/09/18 1035  AST 37  ALT 43  ALKPHOS 88  BILITOT 0.9  PROT 6.6  ALBUMIN 2.7*   No results for input(s): LIPASE, AMYLASE in the last 168 hours. No results for input(s): AMMONIA in the last 168 hours.  ABG    Component Value Date/Time   TCO2 27 11/15/2018 1455     Coagulation Profile: No results for input(s): INR, PROTIME in the last 168 hours.  Cardiac Enzymes: No results for input(s): CKTOTAL, CKMB,  CKMBINDEX, TROPONINI in the last 168 hours.  HbA1C: No results found for: HGBA1C  CBG: No results for input(s): GLUCAP in the last 168 hours.  Review of Systems:   Unable to complete as patient is altered on mechanical ventilation.  Past Medical History  He,  has a past medical history of Asthma, Cancer ([place]), Cough ("for a while"), Pneumonia (dec 2012), and Shortness of breath.   Surgical History    Past Surgical History:  Procedure Laterality Date  . APPENDECTOMY  04/15/2012   Procedure: APPENDECTOMY;  Surgeon: Edward Jolly, MD;  Location: [facility];  Service: General;;  . CATARACT EXTRACTION    . COLON SURGERY    . COLOSTOMY  04/15/2012   Procedure: COLOSTOMY;  Surgeon: Edward Jolly, MD;  Location: [facility];  Service: General;;  . COLOSTOMY TAKEDOWN  12/23/2012   Procedure: COLOSTOMY TAKEDOWN;  Surgeon: Edward Jolly, MD;  Location: [facility];  Service: General;  Laterality: N/A;  TAKEDOWN OF HARTMAN COLOSTOMY   . INTRAOCULAR LENS IMPLANT, SECONDARY    . KNEE SURGERY  1973   Left  . KNEE SURGERY    . LAPAROTOMY  04/15/2012   Procedure: EXPLORATORY LAPAROTOMY;  Surgeon: Edward Jolly, MD;  Location: [facility];  Service: General;  Laterality: N/A;  . PARTIAL COLECTOMY  04/15/2012   Procedure: PARTIAL COLECTOMY;  Surgeon: Edward Jolly, MD;  Location: [facility];  Service: General;;  sigmoid colectomy  . SHOULDER SURGERY  2009  . SHOULDER SURGERY       Social History   reports that he has been smoking cigars. He has a 200.00 pack-year smoking history. He has never used smokeless tobacco. He reports current drug use. Drug: Marijuana. He reports that he does not drink alcohol.   Family History   His family history includes Alzheimer's disease in his father.   Allergies Allergies  Allergen Reactions  . Oxycodone Nausea Only     Home Medications  Prior to Admission medications   Medication Sig Start Date End Date Taking? Authorizing Provider    Fluticasone-Salmeterol (ADVAIR DISKUS) 250-50 MCG/DOSE AEPB Inhale 1 puff into the lungs 2 (two) times daily. 02/05/17  Yes [provider]  LORazepam (ATIVAN) 0.5 MG tablet Take 0.5 mg by mouth 3 (three) times daily as needed for anxiety. 11/16/17  Yes [provider]  Melatonin 10 MG TABS Take 10 mg by mouth at bedtime.   Yes [provider]  PROAIR HFA 108 (90 Base) MCG/ACT inhaler Inhale 2 puffs into the lungs every 6 (six) hours as needed for wheezing or shortness of breath. 01/01/18  Yes [provider]     Critical care time: 35 minutes      Noe Gens, NP-C [facility] Pgr: [phone] or if no answer [phone] 12/11/2018, 12:06 PM

## 2018-12-11 NOTE — Progress Notes (Addendum)
At 1136 I was called by the secretary - patients family had called out stating the patient was having trouble breathing.  Upon entering the room patient taking shallow breaths, sitting up in chair, cont pulse ox device had fallen off of finger - unsure of what sats were at the moment. RT was called to room.  Instructed patient to cough as he has had copious amounts of secretions this morning.  Patient attempting to cough but unable to move any air.  As I was speaking to patient and trying to coach him with coughing/breathing, his eyes drifted away from focusing on me and went into blank stare and was no longer responding.  Breathing had become agonal and code was called.  Patient transferred back to bed and team arrived.  CPR started immediately and MD took lead.  Patient intubated and transferred to ICU. Family helped outside of room during code and updated once patient intubated - Assisted downstairs by chaplain.  Report given to Nydia Bouton, RN at bedside

## 2018-12-11 NOTE — Anesthesia Procedure Notes (Signed)
Procedure Name: Intubation Date/Time: 12/11/2018 11:53 AM Performed by: Lollie Sails, CRNA Pre-anesthesia Checklist: Patient identified, Emergency Drugs available and Suction available Oxygen Delivery Method: Ambu bag Preoxygenation: Pre-oxygenation with 100% oxygen Laryngoscope Size: Glidescope and 4 Grade View: Grade I Tube type: Subglottic suction tube Tube size: 7.5 mm Number of attempts: 2 Airway Equipment and Method: Stylet and Video-laryngoscopy Placement Confirmation: ETT inserted through vocal cords under direct vision,  CO2 detector and breath sounds checked- equal and bilateral Secured at: 23 cm Tube secured with: Resp tube device. Dental Injury: Teeth and Oropharynx as per pre-operative assessment  Difficulty Due To: Difficult Airway- due to anterior larynx Comments: DL x 1 with Miller 2 blade - Grade 3 view - esophageal intubation.   Endotube removed - glidescope with 4 blade - full view of cords - tube passed smoothly.

## 2018-12-11 NOTE — Progress Notes (Signed)
CHART NOTE I was asked to see this patient today for consultation regarding his stage IV non-small cell lung cancer, adenocarcinoma.  On arrival to see the patient he was transferred from the telemetry bed to the ICU for respiratory failure and he is currently intubated and sedated. His son and girlfriend were at the bedside. I personally reviewed his imaging studies and had a lengthy discussion with the patient his son and girlfriend about his condition. I do not think the patient will be a candidate for any aggressive systemic treatment with his current condition.  I strongly recommended for them to consider palliative care and hospice or even withdrawal of care if he has no improvement in his condition in the next day or 2. They were in agreement with the current plan. I did not see a need to leave a formal consult on this patient with terminal condition. Please call if you have any questions.

## 2018-12-11 NOTE — Progress Notes (Signed)
PROGRESS NOTE    Christopher Mora  EXB:[id number] DOB: 1951-01-19 DOA: 12/09/2018 PCP: [name], MD  Brief Narrative:  68 y.o. male with medical history significant of stage IV adenocarcinoma of lung with metastatic disease to right retina who was previously seen at [facility] with biopsy-proven non-small cell lung cancer.  Patient presents to the ER today with shortness of breath, failure to thrive, and increased mucus production.  He reported at home is on 2.5 L and required 4 L in the ER to maintain sats in the low to mid 90s.  He denied fevers at home did endorse as noted mucus production, shortness of breath, decreased strength and general malaise as well as approximate 30 pound weight loss in 2 months.  He did report history of COPD with tobacco dependence of approximately 40 pack years last used October 2019.  Patient has been seen at [facility] but indicated he wanted to move his care here.  In the ER CT was notable for pneumonia without PE as well as worsening disease..  ED Course: CT as above, CBC with a white count 12.8, I have ordered blood cultures, and patient was given azithromycin and ceftriaxone in the ER which I have changed to vancomycin and cefepime on the order set for inpatient hemodynamically patient is afebrile mildly tach in the low 100s respirations teens to 22 with a blood pressure 124/80. Assessment & Plan:   Active Problems:   HCAP (healthcare-associated pneumonia)   Adenocarcinoma of lung, stage 4, right ([place])   Metastasis to nervous system and eye (HCC)   Tobacco abuse, in remission   Cachexia ([place])   Protein-calorie malnutrition, severe  1)HCAP----shortness of breath persist cough persist, WBC 13,   hypoxic overnight now on high flow nasal cannula chest x-ray showed persistent pneumonia continue cefepime DC doxycycline MRSA PCR negative continue nebulizers.    2) biopsy-proven non-small cell lung cancer with metastatic to the retina--- stage IV discussed  with oncologist Dr. Earlie Server,, in basket message as a reminder sent to Dr. Earlie Server for possible inpatient evaluation patient previously evaluated at [facility] patient does not want to go back to [place] he wants to follow-up locally here with Dr. Earlie Server, I made  Dr. Earlie Server aware  3) moderate protein caloric malnutrition--- over 30 pound weight loss, nutritional consult requested, give Remeron for appetite stimulation and sleep,   4) COPD/reformed smoker----patient recently quit smoking, hold off on steroids at this time, most of patient's respiratory symptoms are secondary to #1 above, dilators, supplemental oxygen as advised   Nutrition Problem: Severe Malnutrition Etiology: acute illness, cancer and cancer related treatments     Signs/Symptoms: moderate fat depletion, moderate muscle depletion, percent weight loss Percent weight loss: 10 %    Interventions: MVI, Refer to RD note for recommendations  Estimated body mass index is 17.94 kg/m as calculated from the following:   Height as of this encounter: 5\' 8"  (1.727 m).   Weight as of this encounter: 53.5 kg.  DVT prophylaxis: Lovenox full code Code Status: Family Communication none Disposition Plan Pending clinical improvement  Consultants: None  Procedures: None Antimicrobials: Vanco and  Subjective Patient had an episode of respiratory distress early morning where his saturation drops he became tachypneic and tachycardic was placed on nonrebreather mask currently he is on high flow nasal cannula saturating above 92%.  He is he was resting when he walked in the room was able to wake him up and he reported he is slightly better than earlier  this morning.  Objective: Vitals:   12/11/18 0341 12/11/18 0436 12/11/18 0452 12/11/18 0917  BP:   131/77   Pulse:  (!) 120 (!) 120   Resp:   20   Temp:   98.5 F (36.9 C)   TempSrc:   Oral   SpO2: 95%  93% 95%  Weight:      Height:        Intake/Output Summary (Last  24 hours) at 12/11/2018 1021 Last data filed at 12/11/2018 1517 Gross per 24 hour  Intake 2273.92 ml  Output 425 ml  Net 1848.92 ml   Filed Weights   12/09/18 1032  Weight: 53.5 kg    Examination:  General exam: Appears calm and comfortable  Respiratory system: rhonchi scattered both lungs to auscultation. Respiratory effort normal. Cardiovascular system: S1 & S2 heard, RRR. No JVD, murmurs, rubs, gallops or clicks. No pedal edema. Gastrointestinal system: Abdomen is nondistended, soft and nontender. No organomegaly or masses felt. Normal bowel sounds heard. Central nervous system: Alert and oriented. No focal neurological deficits. Extremities: Symmetric 5 x 5 power. Skin: No rashes, lesions or ulcers Psychiatry: Judgement and insight appear normal. Mood & affect appropriate.     Data Reviewed: I have personally reviewed following labs and imaging studies  CBC: Recent Labs  Lab 12/09/18 1035 12/09/18 1735 12/10/18 0709  WBC 12.8* 12.9* 13.1*  NEUTROABS 10.0*  --  10.1*  HGB 14.2 14.4 13.0  HCT 45.1 45.7 41.6  MCV 95.3 98.7 98.8  PLT 527* 516* 616*   Basic Metabolic Panel: Recent Labs  Lab 12/09/18 1035 12/09/18 1735 12/10/18 0709  NA 138  --  138  K 4.2  --  4.2  CL 98  --  102  CO2 27  --  25  GLUCOSE 109*  --  103*  BUN 26*  --  24*  CREATININE 0.67 0.87 0.62  CALCIUM 9.4  --  8.7*   GFR: Estimated Creatinine Clearance: 67.8 mL/min (by C-G formula based on SCr of 0.62 mg/dL). Liver Function Tests: Recent Labs  Lab 12/09/18 1035  AST 37  ALT 43  ALKPHOS 88  BILITOT 0.9  PROT 6.6  ALBUMIN 2.7*   No results for input(s): LIPASE, AMYLASE in the last 168 hours. No results for input(s): AMMONIA in the last 168 hours. Coagulation Profile: No results for input(s): INR, PROTIME in the last 168 hours. Cardiac Enzymes: No results for input(s): CKTOTAL, CKMB, CKMBINDEX, TROPONINI in the last 168 hours. BNP (last 3 results) No results for input(s):  PROBNP in the last 8760 hours. HbA1C: No results for input(s): HGBA1C in the last 72 hours. CBG: No results for input(s): GLUCAP in the last 168 hours. Lipid Profile: No results for input(s): CHOL, HDL, LDLCALC, TRIG, CHOLHDL, LDLDIRECT in the last 72 hours. Thyroid Function Tests: No results for input(s): TSH, T4TOTAL, FREET4, T3FREE, THYROIDAB in the last 72 hours. Anemia Panel: No results for input(s): VITAMINB12, FOLATE, FERRITIN, TIBC, IRON, RETICCTPCT in the last 72 hours. Sepsis Labs: No results for input(s): PROCALCITON, LATICACIDVEN in the last 168 hours.  Recent Results (from the past 240 hour(s))  Culture, blood (routine x 2) Call MD if unable to obtain prior to antibiotics being given     Status: None (Preliminary result)   Collection Time: 12/09/18  5:10 PM  Result Value Ref Range Status   Specimen Description   Final    BLOOD LEFT FOREARM Performed at [facility], [place] [address] [place],  [address]  Special Requests   Final    BOTTLES DRAWN AEROBIC AND ANAEROBIC Blood Culture adequate volume Performed at [place] [facility] [address] [place], [place] [address]    Culture   Final    NO GROWTH 2 DAYS Performed at [facility] [address], [place], [place] [address]    Report Status PENDING  Incomplete  Culture, sputum-assessment     Status: None   Collection Time: 12/09/18  5:10 PM  Result Value Ref Range Status   Specimen Description SPUTUM  Final   Special Requests Immunocompromised  Final   Sputum evaluation   Final    THIS SPECIMEN IS ACCEPTABLE FOR SPUTUM CULTURE Performed at [facility], [place] [address], [place], [place] [address]    Report Status 12/10/2018 FINAL  Final  Culture, respiratory     Status: None (Preliminary result)   Collection Time: 12/09/18  5:10 PM  Result Value Ref Range Status   Specimen Description   Final    SPUTUM Performed at [facility] [address], [place], [place] [address]    Special Requests   Final    Immunocompromised Reflexed from [phone] Performed at [facility], [place] [address], [place], [place] [address]    Gram Stain   Final    ABUNDANT WBC PRESENT,BOTH PMN AND MONONUCLEAR RARE GRAM POSITIVE COCCI FEW GRAM VARIABLE ROD FEW YEAST Performed at [facility], [place] [address], [place], [place] [address]    Culture PENDING  Incomplete   Report Status PENDING  Incomplete  Culture, blood (routine x 2) Call MD if unable to obtain prior to antibiotics being given     Status: None (Preliminary result)   Collection Time: 12/09/18  5:36 PM  Result Value Ref Range Status   Specimen Description   Final    BLOOD RIGHT ARM Performed at [facility] [address] [place], [place] [address]    Special Requests   Final    BOTTLES DRAWN AEROBIC AND ANAEROBIC Blood Culture adequate volume Performed at [facility] [address] [place], [place] [address]    Culture   Final    NO GROWTH 2 DAYS Performed at [facility] [address] [place], [place] [address]    Report Status PENDING  Incomplete  MRSA PCR Screening     Status: None   Collection Time: 12/10/18  1:04 PM  Result Value Ref Range Status   MRSA by PCR NEGATIVE NEGATIVE Final    Comment:        The GeneXpert MRSA Assay (FDA approved for NASAL specimens only), is one component of a comprehensive MRSA colonization surveillance program. It is not intended to diagnose MRSA infection nor to guide or monitor treatment for MRSA infections. Performed at [facility], [place] [address] [place], [place] [address]          Radiology Studies: Dg Chest 2 View  Result Date: 12/09/2018 CLINICAL DATA:  Cough and shortness of breath. History of metastatic lung cancer. EXAM: CHEST - 2 VIEW COMPARISON:  Chest x-ray dated November 15, 2018. FINDINGS: The heart  size and mediastinal contours are within normal limits. Normal pulmonary vascularity. Atherosclerotic calcification of the aortic arch. The lungs remain hyperinflated with emphysematous changes. Unchanged right suprahilar fullness. Progressive consolidation in the posterior right upper lobe. No pleural effusion or pneumothorax. No acute osseous abnormality. IMPRESSION: 1. Right upper lobe pneumonia, possibly  postobstructive given right suprahilar fullness and history of metastatic lung cancer. 2. COPD. Electronically Signed   By: Titus Dubin M.D.   On: 12/09/2018 11:08   Ct Angio Chest Pe W And/or Wo Contrast  Result Date: 12/09/2018 CLINICAL DATA:  Weakness with choking and coughing when eating. Stage IV lung cancer. EXAM: CT ANGIOGRAPHY CHEST WITH CONTRAST TECHNIQUE: Multidetector CT imaging of the chest was performed using the standard protocol during bolus administration of intravenous contrast. Multiplanar CT image reconstructions and MIPs were obtained to evaluate the vascular anatomy. CONTRAST:  144mL ISOVUE-370 IOPAMIDOL (ISOVUE-370) INJECTION 76% COMPARISON:  Chest CT 11/15/2018. Radiographs 12/09/2018 and 11/15/2018. FINDINGS: Cardiovascular: The pulmonary arteries are well opacified with contrast to the level of the subsegmental branches. There is no evidence of acute pulmonary embolism. There is atherosclerosis of the aorta, great vessels and coronary arteries. The heart size is normal. There is no pericardial effusion. Mediastinum/Nodes: There is confluent soft tissue in the mediastinum and hilar regions, suspicious for persistent lymphadenopathy.Right paratracheal (20 mm on image 39/4) and AP window (18 mm on image 49/4) components are similar to the previous study. Subcarinal component appears worse, measuring 25 mm on image 56/4. No axillary adenopathy. The thyroid gland, trachea and esophagus demonstrate no significant findings. Lungs/Pleura: There is no pleural effusion. Severe  centrilobular and paraseptal emphysema again noted. As seen on recent radiographs, there is consolidation posteriorly in the right upper lobe, most consistent with pneumonia. In addition, there is progressive dependent airspace opacity in both lower lobes. In addition to these probable inflammatory changes, there are several enlarging nodules in the left lung, including a 2.1 x 2.0 cm upper lobe lesion on image 93/10, a 1.9 x 1.3 cm lesion along the inferior medial aspect of the left major fissure (image 145/10) and an 1.1 x 1.5 cm left lower lobe nodule on image 147/10. There are multiple other smaller nodules. A predominately linear 2.2 x 1.0 cm left upper lobe lesion on image 66/10 is stable, probably scarring. No suspicious nodule seen within the aerated portions of the right lung. Upper abdomen: Evaluation of the upper abdomen is limited by paucity of fat. There are bilateral adrenal masses suspicious for metastatic disease. Left adrenal mass measures 3.0 x 2.2 cm on image 114/4. Right adrenal mass is less well-defined and difficult to separate from possible adjacent retroperitoneal adenopathy, measuring up to 4.1 x 3.4 cm on image 113/4. There is a soft tissue nodule posterior to the liver measuring 2.7 x 1.3 cm on image 107/4. No definite hepatic lesions seen. Musculoskeletal/Chest wall: There are multiple lytic osseous lesions consistent with metastatic disease. These are most prominent within the T3 vertebral body (sagittal image 96/7), in the right 5th rib near the costovertebral junction and posteriorly in the right 8th rib. Several other smaller rib metastases are noted. No definite pathologic fracture or epidural tumor in the spine. Review of the MIP images confirms the above findings. IMPRESSION: 1. No evidence of acute pulmonary embolism. 2. Right upper lobe and bibasilar consolidation most consistent with pneumonia. 3. Progressive mediastinal lymphadenopathy, bilateral pulmonary nodularity and  osseous metastatic disease consistent with stage IV lung cancer. In addition, there are bilateral adrenal nodules and increased nodularity in the upper abdomen, also worrisome for progressive metastatic disease. Electronically Signed   By: Richardean Sale M.D.   On: 12/09/2018 13:59   Dg Chest Port 1 View  Result Date: 12/11/2018 CLINICAL DATA:  Shortness of breath EXAM: PORTABLE CHEST 1 VIEW COMPARISON:  Chest CT 12/09/2018  FINDINGS: There is consolidation in the right upper lobe, as demonstrated on the earlier CT. There is no pleural effusion or pneumothorax. Cardiomediastinal size is normal. IMPRESSION: Right upper lobe pneumonia, unchanged. Electronically Signed   By: Ulyses Jarred M.D.   On: 12/11/2018 03:10        Scheduled Meds: . enoxaparin (LOVENOX) injection  40 mg Subcutaneous Q24H  . feeding supplement (ENSURE ENLIVE)  237 mL Oral TID BM  . fluticasone furoate-vilanterol  1 puff Inhalation Daily  . Melatonin  10 mg Oral QHS  . metoprolol tartrate  25 mg Oral BID  . mirtazapine  15 mg Oral QHS  . multivitamin with minerals  1 tablet Oral Daily  . protein supplement  1 scoop Oral TID WC   Continuous Infusions: . sodium chloride Stopped (12/09/18 2249)  . sodium chloride 50 mL/hr at 12/11/18 0200  . ceFEPime (MAXIPIME) IV 1 g (12/11/18 5809)     LOS: 1 day        Georgette Shell, MD  If 7PM-7AM, please contact night-coverage [email or web address] Password [facility] 12/11/2018, 10:21 AM

## 2018-12-11 NOTE — Progress Notes (Addendum)
CRITICAL VALUE ALERT  Critical Value:  Lactic Acid 3.1 Date & Time Notied:  12/11/2018 1519  Provider Notified: Loanne Drilling, MD  Orders Received/Actions taken: Lactic Acid trending down 3.5 to 3.1, paged NP. Patient did receive a 1L NS bolus after code blue and is receiving antibiotics. Will follow up after page.

## 2018-12-11 NOTE — Progress Notes (Signed)
   12/11/18 1200  Clinical Encounter Type  Visited With Family  Visit Type Initial;Psychological support;Spiritual support;Code  Referral From Nurse  Consult/Referral To Chaplain  Spiritual Encounters  Spiritual Needs Emotional;Other (Comment) (Spiritual Care Conversation/Support)  Stress Factors  Patient Stress Factors Not reviewed  Family Stress Factors Health changes;Major life changes;Lack of knowledge   I provided support during a code for the patient's ex-wife and son. Both were very tearful and anxious over the patient's medical condition. I walked with them down to the ICU waiting room.  I provided prayer.    Please, contact Spiritual Care for further assistance.   Chaplain Shanon Ace M.Div., [facility]

## 2018-12-11 NOTE — Progress Notes (Signed)
Patient awoken to complete assessment this morning.  Per report, patient did not sleep much at all last night and has been sleeping this morning.  Upon awakening, patient unsure of location and year.  Easily reoriented and states "yes I remember you" as I was his nurse yesterday as well. bilat BS rhonchi - instructed patient to do deep breathing and coughing and flutter valve.  After each congested cough, patient produced a large amount of thick tan phlegm.  Patient states that it made him feel better.  O2 sats 94% on 8L HFNC.  HR remains 110's.  Will continue to monitor.  Patient denies any needs, wishes to continue resting

## 2018-12-11 NOTE — Progress Notes (Signed)
RT nasal suctioned patient with no complications.Secretions were tan and thick. Patient is now sating 95% on nasal canula salter.RT will continue to monitor.

## 2018-12-11 NOTE — Progress Notes (Signed)
PT Cancellation Note  Patient Details Name: Christopher Mora MRN: [id number] DOB: [birth year]   Cancelled Treatment:    Reason Eval/Treat Not Completed: Other (comment)(decline in medical status) Pt transferred to ICU with decline in medical status. Will d/c PT order at this time. Please re-order when medically appropriate.   Galen Manila 12/11/2018, 1:13 PM

## 2018-12-11 NOTE — Progress Notes (Addendum)
CRITICAL VALUE ALERT  Critical Value:  Troponin 0.05, Lactic Acid 3.5  Date & Time Notied:  12/11/2018 1158  Provider Notified: Loanne Drilling, MD   Orders Received/Actions taken: will notify MD

## 2018-12-11 NOTE — Progress Notes (Signed)
Called to bedside by RN due to pt having SOB and not picking up SPo2 very well. Upon arrival, patient was agonally breathing sitting up in chair on 10L HFNC and RN had initiated code blue. Pt was moved to bed and RT started to manually ventilate via AMBU. Pt was intubated during code by CRNA and then transported to ICU via AMBU with RN's.

## 2018-12-12 ENCOUNTER — Inpatient Hospital Stay (HOSPITAL_COMMUNITY): Payer: Medicare HMO

## 2018-12-12 DIAGNOSIS — J189 Pneumonia, unspecified organism: Secondary | ICD-10-CM

## 2018-12-12 DIAGNOSIS — I361 Nonrheumatic tricuspid (valve) insufficiency: Secondary | ICD-10-CM

## 2018-12-12 LAB — GLUCOSE, CAPILLARY
GLUCOSE-CAPILLARY: 129 mg/dL — AB (ref 70–99)
Glucose-Capillary: 101 mg/dL — ABNORMAL HIGH (ref 70–99)
Glucose-Capillary: 130 mg/dL — ABNORMAL HIGH (ref 70–99)
Glucose-Capillary: 139 mg/dL — ABNORMAL HIGH (ref 70–99)
Glucose-Capillary: 151 mg/dL — ABNORMAL HIGH (ref 70–99)
Glucose-Capillary: 97 mg/dL (ref 70–99)

## 2018-12-12 LAB — COMPREHENSIVE METABOLIC PANEL
ALT: 26 U/L (ref 0–44)
ANION GAP: 10 (ref 5–15)
AST: 33 U/L (ref 15–41)
Albumin: 2.1 g/dL — ABNORMAL LOW (ref 3.5–5.0)
Alkaline Phosphatase: 58 U/L (ref 38–126)
BUN: 32 mg/dL — ABNORMAL HIGH (ref 8–23)
CO2: 21 mmol/L — ABNORMAL LOW (ref 22–32)
Calcium: 8.3 mg/dL — ABNORMAL LOW (ref 8.9–10.3)
Chloride: 108 mmol/L (ref 98–111)
Creatinine, Ser: 0.67 mg/dL (ref 0.61–1.24)
GFR calc Af Amer: >60 mL/min (ref >60)
GFR calc non Af Amer: >60 mL/min (ref >60)
Glucose, Bld: 150 mg/dL — ABNORMAL HIGH (ref 70–99)
POTASSIUM: 4.6 mmol/L (ref 3.5–5.1)
Sodium: 139 mmol/L (ref 135–145)
Total Bilirubin: 0.5 mg/dL (ref 0.3–1.2)
Total Protein: 5.1 g/dL — ABNORMAL LOW (ref 6.5–8.1)

## 2018-12-12 LAB — CBC
HCT: 38.5 % — ABNORMAL LOW (ref 39.0–52.0)
Hemoglobin: 11.9 g/dL — ABNORMAL LOW (ref 13.0–17.0)
MCH: 30.5 pg (ref 26.0–34.0)
MCHC: 30.9 g/dL (ref 30.0–36.0)
MCV: 98.7 fL (ref 80.0–100.0)
NRBC: 0 % (ref 0.0–0.2)
PLATELETS: 337 10*3/uL (ref 150–400)
RBC: 3.9 MIL/uL — AB (ref 4.22–5.81)
RDW: 13.2 % (ref 11.5–15.5)
WBC: 11.6 10*3/uL — AB (ref 4.0–10.5)

## 2018-12-12 LAB — MAGNESIUM: Magnesium: 2 mg/dL (ref 1.7–2.4)

## 2018-12-12 LAB — TROPONIN I: Troponin I: 0.03 ng/mL (ref <0.03)

## 2018-12-12 MED ORDER — CHLORHEXIDINE GLUCONATE 0.12 % MT SOLN
15.0000 mL | Freq: Two times a day (BID) | OROMUCOSAL | Status: DC
  Administered 2018-12-12 – 2018-12-14 (×3): 15 mL via OROMUCOSAL
  Filled 2018-12-12 (×4): qty 15

## 2018-12-12 MED ORDER — MIRTAZAPINE 15 MG PO TABS
15.0000 mg | ORAL_TABLET | Freq: Every day | ORAL | Status: DC
  Administered 2018-12-13: 15 mg via ORAL
  Filled 2018-12-12: qty 1

## 2018-12-12 MED ORDER — ORAL CARE MOUTH RINSE: 15.0000 mL | Freq: Two times a day (BID) | OROMUCOSAL | Status: DC

## 2018-12-12 MED ORDER — METHYLPREDNISOLONE SODIUM SUCC 40 MG IJ SOLR
40.0000 mg | Freq: Two times a day (BID) | INTRAMUSCULAR | Status: DC
  Administered 2018-12-12 – 2018-12-16 (×8): 40 mg via INTRAVENOUS
  Filled 2018-12-12 (×8): qty 1

## 2018-12-12 MED ORDER — MELATONIN 5 MG PO TABS
10.0000 mg | ORAL_TABLET | Freq: Every day | ORAL | Status: DC
  Administered 2018-12-13: 10 mg via ORAL
  Filled 2018-12-12 (×2): qty 2

## 2018-12-12 MED ORDER — INSULIN ASPART 100 UNIT/ML ~~LOC~~ SOLN
2.0000 [IU] | SUBCUTANEOUS | Status: DC
  Administered 2018-12-12: 4 [IU] via SUBCUTANEOUS
  Administered 2018-12-12: 2 [IU] via SUBCUTANEOUS
  Administered 2018-12-12: 3 [IU] via SUBCUTANEOUS

## 2018-12-12 MED ORDER — RANITIDINE HCL 150 MG/10ML PO SYRP
150.0000 mg | ORAL_SOLUTION | Freq: Two times a day (BID) | ORAL | Status: DC
  Administered 2018-12-14: 150 mg via ORAL
  Filled 2018-12-12 (×4): qty 10

## 2018-12-12 NOTE — Progress Notes (Signed)
Nutrition Follow-up  DOCUMENTATION CODES:   Severe malnutrition in context of acute illness/injury, Underweight  INTERVENTION:  - If patient unable to be extubated today, advance Vital AF 1.2 to 35 ml/hr and then by 10 ml every 12 hours to reach goal rate of 55 ml/hr. - Goal rate will provide 1584 kcal, 99 grams of protein, and 1070 mL free water. - Free water flush to be per CCM.  Monitor magnesium, potassium, and phosphorus daily for at least 3 days, MD to replete as needed, as pt is at risk for refeeding syndrome given severe malnutrition, poor PO intakes PTA and inadequate nutrition since admission.    NUTRITION DIAGNOSIS:   Severe Malnutrition related to acute illness, cancer and cancer related treatments as evidenced by moderate fat depletion, moderate muscle depletion, percent weight loss. -ongoing  GOAL:   Patient will meet greater than or equal to 90% of their needs -unmet with current TF rate  MONITOR:   Vent status, TF tolerance, Weight trends, Labs  ASSESSMENT:   68 y.o. male with medical history significant of asthma, COPD, tobacco dependence (last used in 08/2018), stage IV adenocarcinoma of lung with metastatic disease to R retina. He was previously seen at [facility] with biopsy-proven NSCLC.  Patient presented to the ED with SOB, FTT, and increased mucus production. He endorsed mucus production, SOB, decreased strength, general malaise, and approximately 30 pound weight loss in 2 months. In the ED CT was notable for PNA without PE.  Weight trending up since admission (+3.8 kg/8 lb); used current weight of 57.3 kg in re-estimating kcal need as patient was likely dehydrated at time of admission. Patient remains intubated with OGT in place and he is receiving Vital AF 1.2 @ 20 mL/hr which is providing 576 kcal, 36 grams of protein, and 389 ml free water.  Spoke with RN who reports patient is weaning this AM. Spoke with RT who is unsure if patient will or will not be extubated  today. CCM MD and NP in a procedure in another patient room. Recommendations for TF advancement outlined above in case extubation unable to occur today.    Medications reviewed; sliding scale novolog, 60 mg solu-medrol BID, 15 ml liquid multivitamin per OGT/day, 150 mg zantac per OGT BID.  Labs reviewed; CBG: 130 and 139 mg/dl today, BUN: 32 mg/dl, Ca: 8.3 mg/dl.  IVF; NS @ 50 ml/hr started 12/09/2018.    Diet Order:   Diet Order            Diet NPO time specified  Diet effective now              EDUCATION NEEDS:   Not appropriate for education at this time  Skin:  Skin Assessment: Reviewed RN Assessment  Last BM:  PTA/unknown  Height:   Ht Readings from Last 1 Encounters:  12/09/18 5\' 8"  (1.727 m)    Weight:   Wt Readings from Last 1 Encounters:  12/12/18 57.3 kg    Ideal Body Weight:  70 kg  BMI:  Body mass index is 19.21 kg/m.  Estimated Nutritional Needs:   Kcal:  1513 kcal  Protein:  80-90 grams (1.5-1.7 grams/kg)  Fluid:  >/= 1.8 L/day     Jarome Matin, MS, RD, LDN, [facility] Inpatient Clinical Dietitian Pager # [phone] After hours/weekend pager # [phone]

## 2018-12-12 NOTE — Evaluation (Addendum)
Occupational Therapy Re-Evaluation Patient Details Name: Christopher Mora MRN: [id number] DOB: [birth year] Today's Date: 12/12/2018    History of Present Illness 68 y.o. male with medical history significant of stage IV adenocarcinoma of lung with metastatic disease to right retina who was previously seen at [facility] with biopsy-proven non-small cell lung cancer.  Patient presents to the ER today with shortness of breath, failure to thrive, and increased mucus production. In the ER CT was notable for pneumonia without PE as well as worsening disease.  Respiratory arrest on 1/15 with intubation x 1 day.    Clinical Impression   Pt seen after respiratory arrest, vent x 1 day and transfer to ICU/SDU.  He sat up with min A with VSS.  Pt fatiqued but tolerated well.  At baseline, he was independent to supervision for safety/fear of falling for adls.  [name], OTR/L Acute Rehabilitation Services [phone] [facility] pager [phone] office 1/16/2020Goals updated today.    Follow Up Recommendations  Supervision/Assistance - 24 hour;SNF(depending upon progress)    Equipment Recommendations  3 in 1 bedside commode    Recommendations for Other Services       Precautions / Restrictions Precautions Precautions: Fall Precaution Comments: monitor HR, O2 sats Restrictions Weight Bearing Restrictions: No      Mobility Bed Mobility         Supine to sit: Min assist Sit to supine: Min assist   General bed mobility comments: trunk assist for OOB; assist for legs  Transfers                 General transfer comment: not tested    Balance     Sitting balance-Leahy Scale: Fair                                     ADL either performed or assessed with clinical judgement   ADL   Eating/Feeding: NPO   Grooming: Set up   Upper Body Bathing: Moderate assistance   Lower Body Bathing: Maximal assistance   Upper Body Dressing : Moderate assistance   Lower Body  Dressing: Total assistance                 General ADL Comments: pt extubated this am.  Agreeable to sitting EOB, and performed scapula exercises.  He is currently NPO and using yonker for secretions.  Above levels based on clinical judgment. Decreased endurance     Vision         Perception     Praxis      Pertinent Vitals/Pain Pain Assessment: No/denies pain     Hand Dominance     Extremity/Trunk Assessment Upper Extremity Assessment Upper Extremity Assessment: Generalized weakness;RUE deficits/detail RUE Deficits / Details: RUE restricted because of CPR pads still attached           Communication Communication Communication: No difficulties   Cognition Arousal/Alertness: Awake/alert Behavior During Therapy: WFL for tasks assessed/performed                                   General Comments: followed one step commands   General Comments  HR 105-114 sitting EOB, sats in low 90s    Exercises Exercises: (performed scapula retraction exercises)   Shoulder Instructions      Home Living Family/patient expects to be discharged to:: Private residence Living Arrangements: Spouse/significant other  Available Help at Discharge: Family;Available 24 hours/day               Bathroom Shower/Tub: Tub/shower unit;Walk-in shower   Bathroom Toilet: Handicapped height                Prior Functioning/Environment Level of Independence: Independent        Comments: pt's significant other reports she provides supervision for ADL completion due to pt with increased concerns for falling        OT Problem List:        OT Treatment/Interventions:      OT Goals(Current goals can be found in the care plan section) Acute Rehab OT Goals Patient Stated Goal: return home OT Goal Formulation: With patient Time For Goal Achievement: 12/26/18 Potential to Achieve Goals: Good ADL Goals Pt Will Perform Grooming: with min guard assist;standing Pt  Will Perform Lower Body Dressing: with min assist;with adaptive equipment;sit to/from stand Pt Will Transfer to Toilet: with min guard assist;bedside commode;ambulating Pt Will Perform Toileting - Clothing Manipulation and hygiene: with min guard assist;sit to/from stand  OT Frequency: Min 2X/week   Barriers to D/C:            Co-evaluation              AM-PAC OT "6 Clicks" Daily Activity     Outcome Measure Help from another person eating meals?: A Little Help from another person taking care of personal grooming?: A Little Help from another person toileting, which includes using toliet, bedpan, or urinal?: A Lot Help from another person bathing (including washing, rinsing, drying)?: A Lot Help from another person to put on and taking off regular upper body clothing?: A Lot Help from another person to put on and taking off regular lower body clothing?: Total 6 Click Score: 13   End of Session    Activity Tolerance: Patient tolerated treatment well Patient left: in bed;with call bell/phone within reach(with echo tech)  OT Visit Diagnosis: Muscle weakness (generalized) (M62.81)                Time: 1453-1510 OT Time Calculation (min): 17 min Charges:  OT General Charges $OT Visit: 1 Visit OT Evaluation $OT Re-eval: 1 Re-eval  [name], OTR/L Acute Rehabilitation Services [phone] [facility] pager [phone] office 12/12/2018  [place] 12/12/2018, 3:35 PM

## 2018-12-12 NOTE — Plan of Care (Signed)
Educated patient on importance of incentive spirometry for lung mobility and respiratory reserve. Pt demonstrated understanding of correct use of spirometer and agreed to use with encouragement hourly while awake.

## 2018-12-12 NOTE — Progress Notes (Signed)
  Echocardiogram 2D Echocardiogram has been performed.  [name] 12/12/2018, 3:50 PM

## 2018-12-12 NOTE — Procedures (Signed)
Extubation Procedure Note  Patient Details:   Name: Arhum Peeples DOB: July 10, 1951 MRN: [id number]   Airway Documentation:    Vent end date: 12/12/18 Vent end time: 1146   Evaluation  O2 sats: stable throughout Complications: No apparent complications Patient did tolerate procedure well. Bilateral Breath Sounds: Expiratory wheezes   Yes  Johnette Abraham 12/12/2018, 11:47 AM

## 2018-12-12 NOTE — Evaluation (Signed)
Clinical/Bedside Swallow Evaluation Patient Details  Name: Jaydis Duchene MRN: [id number] Date of Birth: 1951-02-28  Today's Date: 12/12/2018 Time: SLP Start Time (ACUTE ONLY): 1430 SLP Stop Time (ACUTE ONLY): 1452 SLP Time Calculation (min) (ACUTE ONLY): 22 min  Past Medical History:  Past Medical History:  Diagnosis Date  . Asthma   . Cancer ([place])   . Cough "for a while"   nonproductive  . Pneumonia dec 2012  . Shortness of breath    occasional   Past Surgical History:  Past Surgical History:  Procedure Laterality Date  . APPENDECTOMY  04/15/2012   Procedure: APPENDECTOMY;  Surgeon: Edward Jolly, MD;  Location: [facility];  Service: General;;  . CATARACT EXTRACTION    . COLON SURGERY    . COLOSTOMY  04/15/2012   Procedure: COLOSTOMY;  Surgeon: Edward Jolly, MD;  Location: [facility];  Service: General;;  . COLOSTOMY TAKEDOWN  12/23/2012   Procedure: COLOSTOMY TAKEDOWN;  Surgeon: Edward Jolly, MD;  Location: [facility];  Service: General;  Laterality: N/A;  TAKEDOWN OF HARTMAN COLOSTOMY   . INTRAOCULAR LENS IMPLANT, SECONDARY    . KNEE SURGERY  1973   Left  . KNEE SURGERY    . LAPAROTOMY  04/15/2012   Procedure: EXPLORATORY LAPAROTOMY;  Surgeon: Edward Jolly, MD;  Location: [facility];  Service: General;  Laterality: N/A;  . PARTIAL COLECTOMY  04/15/2012   Procedure: PARTIAL COLECTOMY;  Surgeon: Edward Jolly, MD;  Location: [facility];  Service: General;;  sigmoid colectomy  . SHOULDER SURGERY  2009  . SHOULDER SURGERY     HPI:  68 yo male admitted to [facility] with respiratory distress.  Pt PMH + for Stage IV lung cancer = diagnosed with right upper lobe pna = respiratory and cardiac arrest.     Assessment / Plan / Recommendation Clinical Impression  Patient presents with clinical indications of gross aspiration of even secretions at this time.  SLP questions if pt aspirated on the floor prior to respiratory event causing cardiac arrest.  Voice is gurgly - which pt  could clear with cough/expectoration but would recur within a few minutes.  Appears with mild right facial asymmetry - ? facial nerve impairment and lingual deviation to left with left weakness *hypoglossal nerve deficit* concerning for neuro deficits.  Only provided pt with single ice chip bolus due to concerns for aspiration.  Congested cough/voice noted after swallowing.  When SLP asked if pt desired to eat if he knew he was aspirating, he stated no.  He admits premorbid issues swallowing where he senses food lodging pointing to vallecular region.  He admits lack of appetite and dysphagia have contributed to 20 pound weight loss he has exprienced in the last 2 months.  Advised will proceed with MBS next date - and to SLP concerns of chronic aspiration prior to admit with goal being to mitigate aspiration/increased ability obtain nutrition comfortably.   SLP Visit Diagnosis: Dysphagia, pharyngeal phase (R13.13)    Aspiration Risk  Severe aspiration risk;Risk for inadequate nutrition/hydration    Diet Recommendation NPO   Medication Administration: Via alternative means Postural Changes: Seated upright at 90 degrees;Remain upright for at least 30 minutes after po intake    Other  Recommendations Oral Care Recommendations: Oral care QID   Follow up Recommendations (tbd)      Frequency and Duration      tbd      Prognosis Prognosis for Safe Diet Advancement: Guarded Barriers to Reach Goals: Severity of  deficits;Time post onset;Other (Comment)(respiratory arrest)      Swallow Study   General Date of Onset: 12/12/18 HPI: 68 yo male admitted to [facility] with respiratory distress.  Pt PMH + for Stage IV lung cancer = diagnosed with right upper lobe pna = respiratory and cardiac arrest.   Type of Study: Bedside Swallow Evaluation Previous Swallow Assessment: none  Diet Prior to this Study: NPO Temperature Spikes Noted: No Respiratory Status: Nasal cannula(3) History of Recent Intubation:  Yes Length of Intubations (days): 2 days Date extubated: 12/12/18 Behavior/Cognition: Alert;Cooperative;Pleasant mood Oral Cavity Assessment: Excessive secretions Oral Care Completed by SLP: Yes Oral Cavity - Dentition: Adequate natural dentition Vision: Functional for self-feeding Self-Feeding Abilities: Able to feed self Patient Positioning: Upright in bed Baseline Vocal Quality: Wet;Suspected CN X (Vagus) involvement Volitional Cough: Wet;Congested;Weak;Strong Volitional Swallow: Able to elicit    Oral/Motor/Sensory Function Overall Oral Motor/Sensory Function: Moderate impairment Facial Symmetry: (mild right facial asymmetry) Lingual ROM: Reduced left Lingual Symmetry: Abnormal symmetry left Lingual Strength: Suspected CN XII (hypoglossal) dysfunction;Reduced Velum: Other (comment)(appeared sluggish but bilateral) Mandible: (dnt)   Ice Chips Ice chips: Impaired Presentation: Spoon Pharyngeal Phase Impairments: Multiple swallows;Wet Vocal Quality;Throat Clearing - Immediate Other Comments: cough and expectoration   Thin Liquid Thin Liquid: Not tested    Nectar Thick Nectar Thick Liquid: Not tested   Honey Thick Honey Thick Liquid: Not tested   Puree Puree: Not tested   Solid     Solid: Not tested      Macario Golds 12/12/2018,3:40 PM  Luanna Salk, MS [place] Pager [phone] Office [phone]

## 2018-12-12 NOTE — Progress Notes (Addendum)
NAME:  Christopher Mora, MRN:  [id number], DOB:  1950-11-29, LOS: 2 ADMISSION DATE:  12/09/2018, CONSULTATION DATE:  12/11/18 REFERRING MD:  Dr. Zigmund Daniel / [facility] , CHIEF COMPLAINT:  Respiratory Arrest    Brief History   68 y/o M admitted on 1/13 with complaints of SOB.  Work up concerning for PNA.  CTA chest negative for PE on admit.  Early am 1/15, rapid response was called to evaluate for respiratory distress which improved with O2 & NTS. He developed increased SOB later afternoon 1/15 and suffered a respiratory leading to cardiac arrest.  Intubated per EDP and transferred to ICU.  Woke post arrest / following commands.   Past Medical History  Stage IV Adenocarcinoma of the Lung with Metastasis to the retina.    Significant Hospital Events   1/13  Admit with SOB, concern for PNA 1/15  Respiratory leading to cardiac arrest (~ 6-9 min CPR before ROSC)  Consults:  PCCM   Procedures:  ETT 1/15 >> 1/16  Significant Diagnostic Tests:  CTA Chest 1/13 >> neg for PE, RUL and bibasilar consolidation, mediastinal LAN, bilateral pulmonary nodularity & osseous metastatic disease, possible adrenal involvement  ECHO 1/16 >>   Micro Data:  Sputum 1/13 >>  BCx2 1/13 >>  U. Strep Antigen 1/13 >> negative   Antimicrobials:  Vanco 1/13 >>  Cefepime 1/13 >>   Interim history/subjective:  RT reports pt on wean, no distress.  Tolerating 5/5.  Family at bedside.   Objective   Blood pressure (!) 144/84, pulse (!) 107, temperature 97.8 F (36.6 C), temperature source Axillary, resp. rate 18, height 5\' 8"  (1.727 m), weight 57.3 kg, SpO2 95 %.    Vent Mode: PSV;CPAP FiO2 (%):  [40 %-100 %] 40 % Set Rate:  [14 bmp-18 bmp] 18 bmp Vt Set:  [540 mL] 540 mL PEEP:  [5 cmH20] 5 cmH20 Pressure Support:  [5 cmH20] 5 cmH20 Plateau Pressure:  [9 cmH20-15 cmH20] 14 cmH20   Intake/Output Summary (Last 24 hours) at 12/12/2018 1130 Last data filed at 12/12/2018 1000 Gross per 24 hour  Intake 1859.58 ml  Output  350 ml  Net 1509.58 ml   Filed Weights   12/09/18 1032 12/12/18 0458  Weight: 53.5 kg 57.3 kg    Examination: General: adult male lying in bed in NAD on vent HEENT: MM pink/moist, ETT, rounded mass on right posterior head  Neuro: Awake/alert, follows commands, MAE / normal strength CV: s1s2 rrr, no m/r/g PULM: even/non-labored, coarse rhonchi on R, occasional wheeze MV:[facility], non-tender, bsx4 active  Extremities: warm/dry, no edema  Skin: no rashes or lesions  Resolved Hospital Problem list      Assessment & Plan:   Respiratory leading to Cardiac Arrest  -suspect secretion clearance, hypercarbia, overall muscle weakness / fatigue as contributing factors & possible sedating effects from medications P: Change to SDU status post extubation   Acute Hypercarbic / Hypoxic Respiratory Failure  P:  Proceed with extubation SLP evaluation post extubation with concerns for possible aspiration event earlier in hospitalization Flutter valve, IS, mobilize   RUL PNA  P: Continue cefepime, vancomycin for now  Follow sputum culture  Trend CXR   Stage IV Adenocarcinoma with Metastasis to Retina -concern for bony metastasis, adrenal on CTA chest this admit  P: Oncology requested per [facility] prior to arrest  COPD P: Continue brovana + pulmicort  PRN xopenex  Solumedrol 40 mg IV Q12  Hold home advair for now   Acute Encephalopathy  -suspect multifactorial in  setting of hypercarbia, post arrest, potential hypoxia.  R/O anoxic contribution  P:  Follow neuro exam, appears to be at baseline  Minimize sedating medications  PT efforts   Leukocytosis  -suspect multifactorial in setting of PNA, steroids, post arrest P: Follow fever curve / WBC trend   Failure to Thrive  P: Palliative care consulted  Best practice:  Diet: NPO Pain/Anxiety/Delirium protocol (if indicated): n/a VAP protocol (if indicated): in place  DVT prophylaxis: heparin  GI prophylaxis: pepcid  Glucose  control: n/a  Mobility: bed rest  Code Status: Full Code  Family Communication: Son, girlfriend updated at bedside.  Disposition: SDU, tx back to [place] as of 1/17 am.    Labs   CBC: Recent Labs  Lab 12/09/18 1035 12/10/18 0709 12/11/18 1158 12/12/18 0316  WBC 12.8* 13.1* 22.2* 11.6*  NEUTROABS 10.0* 10.1*  --   --   HGB 14.2 13.0 13.6 11.9*  HCT 45.1 41.6 45.2 38.5*  MCV 95.3 98.8 100.9* 98.7  PLT 527* 428* 502* 161    Basic Metabolic Panel: Recent Labs  Lab 12/09/18 1035 12/10/18 0709 12/11/18 1158 12/11/18 1201 12/12/18 0316  NA 138 138 139  --  139  K 4.2 4.2 4.5  --  4.6  CL 98 102 104  --  108  CO2 27 25 24   --  21*  GLUCOSE 109* 103* 110*  --  150*  BUN 26* 24* 27*  --  32*  CREATININE 0.67 0.62 0.87  --  0.67  CALCIUM 9.4 8.7* 8.9  --  8.3*  MG  --   --   --  2.2 2.0  PHOS  --   --   --  5.5*  --    GFR: Estimated Creatinine Clearance: 72.6 mL/min (by C-G formula based on SCr of 0.67 mg/dL). Recent Labs  Lab 12/09/18 1035 12/10/18 0709 12/11/18 1158 12/11/18 1426 12/12/18 0316  WBC 12.8* 13.1* 22.2*  --  11.6*  LATICACIDVEN  --   --  3.5* 3.1*  --     Liver Function Tests: Recent Labs  Lab 12/09/18 1035 12/11/18 1158 12/11/18 1201 12/12/18 0316  AST 37 44* 43* 33  ALT 43 30 29 26   ALKPHOS 88 78 77 58  BILITOT 0.9 0.9 0.8 0.5  PROT 6.6 6.1* 6.1* 5.1*  ALBUMIN 2.7* 2.6* 2.6* 2.1*   No results for input(s): LIPASE, AMYLASE in the last 168 hours. No results for input(s): AMMONIA in the last 168 hours.  ABG    Component Value Date/Time   PHART 7.246 (L) 12/11/2018 1416   PCO2ART 52.8 (H) 12/11/2018 1416   PO2ART 89.7 12/11/2018 1416   HCO3 22.2 12/11/2018 1416   TCO2 27 11/15/2018 1455   ACIDBASEDEF 5.1 (H) 12/11/2018 1416   O2SAT 94.7 12/11/2018 1416     Coagulation Profile: Recent Labs  Lab 12/11/18 1158  INR 1.27    Cardiac Enzymes: Recent Labs  Lab 12/11/18 1158 12/11/18 1808 12/12/18 0024  TROPONINI 0.05* 0.04* 0.03*     HbA1C: No results found for: HGBA1C  CBG: Recent Labs  Lab 12/11/18 1627 12/11/18 1957 12/11/18 2341 12/12/18 0348 12/12/18 0812  GLUCAP 85 118* 136* 130* 139*    Critical care time: 30 minutes      Noe Gens, NP-C [facility] Pgr: [phone] or if no answer [phone] 12/12/2018, 11:30 AM

## 2018-12-12 NOTE — Progress Notes (Signed)
[place] Progress Note Patient Name: Christopher Mora DOB: 1951/03/22 MRN: [id number]   Date of Service  12/12/2018  HPI/Events of Note  Extubated and awaiting ST evaluation of swallowing in the AM, he is on po Remeron and Zantac.  eICU Interventions  PO meds ordered held pending ST evaluation in AM        [place] U Ogan 12/12/2018, 9:30 PM

## 2018-12-13 ENCOUNTER — Inpatient Hospital Stay (HOSPITAL_COMMUNITY): Payer: Medicare HMO

## 2018-12-13 LAB — BASIC METABOLIC PANEL
Anion gap: 10 (ref 5–15)
BUN: 34 mg/dL — ABNORMAL HIGH (ref 8–23)
CO2: 22 mmol/L (ref 22–32)
Calcium: 8.3 mg/dL — ABNORMAL LOW (ref 8.9–10.3)
Chloride: 109 mmol/L (ref 98–111)
Creatinine, Ser: 0.53 mg/dL — ABNORMAL LOW (ref 0.61–1.24)
GFR calc non Af Amer: >60 mL/min (ref >60)
Glucose, Bld: 121 mg/dL — ABNORMAL HIGH (ref 70–99)
Potassium: 4.2 mmol/L (ref 3.5–5.1)
Sodium: 141 mmol/L (ref 135–145)

## 2018-12-13 LAB — CBC
HCT: 35.5 % — ABNORMAL LOW (ref 39.0–52.0)
Hemoglobin: 11 g/dL — ABNORMAL LOW (ref 13.0–17.0)
MCH: 30.8 pg (ref 26.0–34.0)
MCHC: 31 g/dL (ref 30.0–36.0)
MCV: 99.4 fL (ref 80.0–100.0)
Platelets: 295 10*3/uL (ref 150–400)
RBC: 3.57 MIL/uL — ABNORMAL LOW (ref 4.22–5.81)
RDW: 13.2 % (ref 11.5–15.5)
WBC: 17.4 10*3/uL — AB (ref 4.0–10.5)
nRBC: 0 % (ref 0.0–0.2)

## 2018-12-13 LAB — GLUCOSE, CAPILLARY
GLUCOSE-CAPILLARY: 120 mg/dL — AB (ref 70–99)
Glucose-Capillary: 120 mg/dL — ABNORMAL HIGH (ref 70–99)
Glucose-Capillary: 110 mg/dL — ABNORMAL HIGH (ref 70–99)

## 2018-12-13 NOTE — Progress Notes (Signed)
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   Assessment & Plan:   Active Problems:   HCAP (healthcare-associated pneumonia)   Adenocarcinoma of lung, stage 4, right ([place])   Metastasis to nervous system and eye ([place])   Tobacco abuse, in remission   Cachexia ([place])   Protein-calorie malnutrition, severe   Community acquired pneumonia of right upper lobe of lung ([place])   Primary malignant neoplasm of lung metastatic to other site [facility])   Acute respiratory distress   #) Acute hypoxic respiratory failure due to pneumonia: At this time the most likely etiology is community-acquired pneumonia but the severity of his symptoms merited broadening of treatment particularly in the setting of the arrest. -Continue IV cefepime started 12/09/2018 -Continue IV methylprednisolone 40 mg every 12 hours -Blood cultures on 12/09/2021 no growth to date -Sputum culture on 12/10/2023 reintubated for more growth  #) Respiratory arrest: Likely secondary pneumonia.  Patient did receive 1 shock due to possible VT/V. fib.  Troponins were flat.  CT on admission was negative -Echo showed normal EF with no wall motion normalities and grade 1 diastolic dysfunction -Extubated 12/12/2018 -Pending speech language pathology barium swallow eval  #) COPD: Patient was on room air at home -Continue LABA/ICS -Continue PRN bronchodilators -IV steroids per above  #) Stage IV adenocarcinoma: Widespread metastatic disease to spine, upper abdomen, thorax.  Patient is opted to transfer his care from [place] due to closeness of area. -Dr.  Earlie Server has seen the patient and at this time recommended palliative care -Outpatient follow-up  #) Protein calorie malnutrition: Likely secondary to cancer -Nutrition consult -Continue mirtazapine 50 mg nightly  Fluids: Gentle IV fluids Electrolyte: Monitor and supplement Nutrition: Regular diet next Prophylaxis: Subcu heparin   Disposition: Pending resolution of pneumonia and acute hypoxic respiratory failure  Full code    Consultants:   PCCM  Oncology  Procedures:   Intubation 12/11/2018, extubation 12/12/2018   Echo 12/12/2018 - Procedure narrative: Transthoracic echocardiography. Image   quality was poor. The study was technically difficult, as a   result of poor acoustic windows, poor sound wave transmission,   and restricted patient mobility. - Left ventricle: The cavity size was normal. Wall thickness was   normal. Systolic function was normal. The estimated ejection   fraction was in the range of 50% to 55%. Wall motion was normal;   there were no regional wall motion abnormalities. Doppler   parameters are consistent with abnormal left ventricular   relaxation (grade 1 diastolic dysfunction). - Atrial septum: There was an atrial septal aneurysm. - Pulmonary arteries: Systolic pressure was moderately increased.   PA peak pressure: 57 mm Hg (S). - Pericardium, extracardiac: A small pericardial effusion was   identified.  Impressions:  - Normal LV systolic function; mild diastolic dysfunction; mild TR    with moderate pulmonary hypertension; small pericardial effusion.  Antimicrobials:  IV vancomycin 12/09/2020 12/11/2018  IV cefepime 12/09/2020 ongoing   Subjective: This morning patient reports feeling fairly well.  He denies any chest pain, nausea, vomiting, diarrhea.  He is somewhat short of breath but denies any sputum production.  Objective: Vitals:   12/13/18 0600  12/13/18 0700 12/13/18 0800 12/13/18 0838  BP: (!) 157/99  (!) 157/87   Pulse:  (!) 110 (!) 51 (!) 109   Resp: 17 16 19    Temp: 98.2 F (36.8 C) 98.8 F (37.1 C) 98.8 F (37.1 C)   TempSrc:      SpO2: 94% 95% 93% 91%  Weight:      Height:        Intake/Output Summary (Last 24 hours) at 12/13/2018 5102 Last data filed at 12/13/2018 0800 Gross per 24 hour  Intake 1806.68 ml  Output 1300 ml  Net 506.68 ml   Filed Weights   12/09/18 1032 12/12/18 0458  Weight: 53.5 kg 57.3 kg    Examination:  General exam: Appears calm and comfortable  Respiratory system: Mildly increased work of breathing, crackles and rhonchi on right, no wheezes Cardiovascular system: Regular rate and rhythm, no murmurs Gastrointestinal system: Soft, nontender, no rebound or guarding, plus bowel sounds Central nervous system: Alert and oriented.  Grossly intact, moving all extremities Extremities: No lower extremity edema Skin: No rashes over visible skin Psychiatry: Judgement and insight appear normal. Mood & affect appropriate.     Data Reviewed: I have personally reviewed following labs and imaging studies  CBC: Recent Labs  Lab 12/09/18 1035 12/10/18 0709 12/11/18 1158 12/12/18 0316 12/13/18 0304  WBC 12.8* 13.1* 22.2* 11.6* 17.4*  NEUTROABS 10.0* 10.1*  --   --   --   HGB 14.2 13.0 13.6 11.9* 11.0*  HCT 45.1 41.6 45.2 38.5* 35.5*  MCV 95.3 98.8 100.9* 98.7 99.4  PLT 527* 428* 502* 337 585   Basic Metabolic Panel: Recent Labs  Lab 12/09/18 1035 12/10/18 0709 12/11/18 1158 12/11/18 1201 12/12/18 0316 12/13/18 0304  NA 138 138 139  --  139 141  K 4.2 4.2 4.5  --  4.6 4.2  CL 98 102 104  --  108 109  CO2 27 25 24   --  21* 22  GLUCOSE 109* 103* 110*  --  150* 121*  BUN 26* 24* 27*  --  32* 34*  CREATININE 0.67 0.62 0.87  --  0.67 0.53*  CALCIUM 9.4 8.7* 8.9  --  8.3* 8.3*  MG  --   --   --  2.2 2.0  --   PHOS  --   --   --  5.5*  --   --    GFR: Estimated Creatinine Clearance: 72.6 mL/min (A) (by C-G formula based on SCr of 0.53 mg/dL (L)). Liver Function  Tests: Recent Labs  Lab 12/09/18 1035 12/11/18 1158 12/11/18 1201 12/12/18 0316  AST 37 44* 43* 33  ALT 43 30 29 26   ALKPHOS 88 78 77 58  BILITOT 0.9 0.9 0.8 0.5  PROT 6.6 6.1* 6.1* 5.1*  ALBUMIN 2.7* 2.6* 2.6* 2.1*   No results for input(s): LIPASE, AMYLASE in the last 168 hours. No results for input(s): AMMONIA in the last 168 hours. Coagulation Profile: Recent Labs  Lab 12/11/18 1158  INR 1.27   Cardiac Enzymes: Recent Labs  Lab 12/11/18 1158 12/11/18 1808 12/12/18 0024  TROPONINI 0.05* 0.04* 0.03*   BNP (last 3 results) No results for input(s): PROBNP in the last 8760 hours. HbA1C: No results for input(s): HGBA1C in the last 72 hours. CBG: Recent Labs  Lab 12/12/18 1628 12/12/18 1937 12/12/18 2338 12/13/18 0419 12/13/18 0747  GLUCAP 101* 129* 97 120* 110*   Lipid Profile: No results for input(s): CHOL, HDL, LDLCALC, TRIG, CHOLHDL, LDLDIRECT in the last  72 hours. Thyroid Function Tests: No results for input(s): TSH, T4TOTAL, FREET4, T3FREE, THYROIDAB in the last 72 hours. Anemia Panel: No results for input(s): VITAMINB12, FOLATE, FERRITIN, TIBC, IRON, RETICCTPCT in the last 72 hours. Sepsis Labs: Recent Labs  Lab 12/11/18 1158 12/11/18 1426  LATICACIDVEN 3.5* 3.1*    Recent Results (from the past 240 hour(s))  Culture, blood (routine x 2) Call MD if unable to obtain prior to antibiotics being given     Status: None (Preliminary result)   Collection Time: 12/09/18  5:10 PM  Result Value Ref Range Status   Specimen Description   Final    BLOOD LEFT FOREARM Performed at [facility], [place] [address] [place], [place] [address]    Special Requests   Final    BOTTLES DRAWN AEROBIC AND ANAEROBIC Blood Culture adequate volume Performed at [place] [address] [place], [place] [address]    Culture   Final    NO GROWTH 4 DAYS Performed at [facility], [place] [address], [place], [place] [address]     Report Status PENDING  Incomplete  Culture, sputum-assessment     Status: None   Collection Time: 12/09/18  5:10 PM  Result Value Ref Range Status   Specimen Description SPUTUM  Final   Special Requests Immunocompromised  Final   Sputum evaluation   Final    THIS SPECIMEN IS ACCEPTABLE FOR SPUTUM CULTURE Performed at [facility], [place] [address], [place], [place] [address]    Report Status 12/10/2018 FINAL  Final  Culture, respiratory     Status: None (Preliminary result)   Collection Time: 12/09/18  5:10 PM  Result Value Ref Range Status   Specimen Description   Final    SPUTUM Performed at [place] [address], [place], [place] [address]    Special Requests   Final    Immunocompromised Reflexed from [phone] Performed at [facility], [place] [address], [place], [place] [address]    Gram Stain   Final    ABUNDANT WBC PRESENT,BOTH PMN AND MONONUCLEAR RARE GRAM POSITIVE COCCI FEW GRAM VARIABLE ROD FEW YEAST    Culture   Final    CULTURE REINCUBATED FOR BETTER GROWTH Performed at [facility], [place] [address] [place], [place] [address]    Report Status PENDING  Incomplete  Culture, blood (routine x 2) Call MD if unable to obtain prior to antibiotics being given     Status: None (Preliminary result)   Collection Time: 12/09/18  5:36 PM  Result Value Ref Range Status   Specimen Description   Final    BLOOD RIGHT ARM Performed at [place] [address], [place], [place] [address]    Special Requests   Final    BOTTLES DRAWN AEROBIC AND ANAEROBIC Blood Culture adequate volume Performed at [place] [address], [place], [place] [address]    Culture   Final    NO GROWTH 4 DAYS Performed at [facility], [place] [address], [place],  [address]    Report Status PENDING  Incomplete  MRSA PCR Screening     Status: None   Collection Time: 12/10/18  1:04  PM  Result Value Ref Range Status   MRSA by PCR NEGATIVE NEGATIVE Final    Comment:        The GeneXpert MRSA Assay (FDA approved for NASAL specimens only), is one component  of a comprehensive MRSA colonization surveillance program. It is not intended to diagnose MRSA infection nor to guide or monitor treatment for MRSA infections. Performed at [facility], [place] [address] [place], [place] [address]          Radiology Studies: Dg Abd 1 View  Result Date: 12/11/2018 CLINICAL DATA:  Check gastric catheter placement EXAM: ABDOMEN - 1 VIEW COMPARISON:  None. FINDINGS: Gastric catheter is noted within the stomach. Scattered large and small bowel gas is noted. No acute bony abnormality is seen. IMPRESSION: Gastric catheter within the stomach. Electronically Signed   By: Inez Catalina M.D.   On: 12/11/2018 16:05   Dg Chest Port 1 View  Result Date: 12/13/2018 CLINICAL DATA:  Respiratory failure EXAM: PORTABLE CHEST 1 VIEW COMPARISON:  12/12/2018 FINDINGS: Cardiac shadow is stable. Aortic calcifications are again seen. Endotracheal tube and nasogastric catheter have been removed in the interval. Hyperinflation is again seen. Increasing infiltrate is noted particularly in the right upper lobe. The left lung is clear but hyperinflated. No bony abnormality is seen. IMPRESSION: Increasing right upper lobe infiltrate. Electronically Signed   By: Inez Catalina M.D.   On: 12/13/2018 07:07   Dg Chest Port 1 View  Result Date: 12/12/2018 CLINICAL DATA:  Respiratory failure. Endotracheal position assessment. EXAM: PORTABLE CHEST 1 VIEW COMPARISON:  12/11/2018 FINDINGS: Endotracheal tube tip is 6.6 cm above the carina. Nasogastric tube enters the stomach. Chronic lung disease persists, with persistent pneumonia right worse than left. No measurable effusion. IMPRESSION: Endotracheal tube tip 6.6 cm above the carina. Persistent pneumonia right more than left. Electronically Signed   By:  Nelson Chimes M.D.   On: 12/12/2018 06:55   Dg Chest Port 1 View  Result Date: 12/11/2018 CLINICAL DATA:  Cardiac arrest. EXAM: PORTABLE CHEST 1 VIEW COMPARISON:  Chest x-ray from same day at 2:45 a.m. FINDINGS: Interval placement of an endotracheal tube with the tip at the thoracic inlet, approximately 8.1 cm above the carina. The heart size and mediastinal contours are within normal limits. Normal pulmonary vascularity. The lungs remain hyperinflated with emphysematous changes. Unchanged consolidation in the right upper lobe. Unchanged patchy opacity at the right lung base. No pleural effusion or pneumothorax. No acute osseous abnormality. IMPRESSION: 1. Endotracheal tube at the thoracic inlet, 8.1 cm above the carina. Consider advancing 3 cm. 2. Unchanged right upper and lower lobe pneumonia. 3. COPD. Electronically Signed   By: Titus Dubin M.D.   On: 12/11/2018 12:38        Scheduled Meds: . arformoterol  15 mcg Nebulization BID  . budesonide (PULMICORT) nebulizer solution  0.5 mg Nebulization BID  . chlorhexidine  15 mL Mouth Rinse BID  . heparin injection (subcutaneous)  5,000 Units Subcutaneous Q8H  . mouth rinse  15 mL Mouth Rinse q12n4p  . Melatonin  10 mg Oral QHS  . methylPREDNISolone (SOLU-MEDROL) injection  40 mg Intravenous Q12H  . mirtazapine  15 mg Oral QHS  . ranitidine  150 mg Oral BID   Continuous Infusions: . sodium chloride 10 mL/hr at 12/13/18 0800  . sodium chloride 50 mL/hr at 12/13/18 0800  . ceFEPime (MAXIPIME) IV Stopped (12/13/18 0531)     LOS: 3 days    Time spent: [place], MD [facility]  If 7PM-7AM, please contact night-coverage [email or web address] Password [facility] 12/13/2018, 9:29 AM

## 2018-12-13 NOTE — Progress Notes (Signed)
Modified Barium Swallow Progress Note  Patient Details  Name: Antione Obar MRN: [id number] Date of Birth: [birth year]  Today's Date: 12/13/2018  Modified Barium Swallow completed.  Full report located under Chart Review in the Imaging Section.  Brief recommendations include the following:  Clinical Impression  Pt presents with mild oral and gross pharyngeal=cervical esophageal dysphagia characterized by minimal motility resulting in gross residuals across all consistencies.   Pt was only given few boluses of liquids and single bolus of pudding due to level of dysphagia.  Residuals predominantly vallecular region due to very poor tongue base retraction and epiglottic deflection with impaired sensation.  SLP questions primarily vagal and hypoglossal neve involvements - ? compression on recurrent laryngeal nerve from mass?  Pt only transited approximately 10% of boluses into esophagus - large amount retained in pharynx/vallecular region precariously on top of the epiglottis.  Cued "hock" helpful for pt to expectorate but fatigued pt excessively.   He appeared more dyspneic today and he admitted this to be accurate.  Pt did not aspirate but given very poor pharyngeal motility = he is certainly aspirating even secretions at this time.  Multiple cued swallows with chin tuck helpful to decrease pyriform sinus residuals of liquids and secretions but were exhausting for pt to perform and did not clear vallecular region.  This pt's swallow is grossly impaired which prohibits him from nutritional support.  Note possible hospice referral - Recommend prior to goals of care, pt be allowed single SMALL ice chips - that he allows to melt fully with chin tuck posture conducting multiple swallows.  Ice chips willl enhance oral care and hopefully help to aid clearance of pharyngeal secretions.  If pt desires po with accepted risks, liquids may be most comforting for him given gross residuals and risk of aspirating solids.   Will follow up for education with this pt.     Swallow Evaluation Recommendations       SLP Diet Recommendations: Ice chips PRN after oral care  Must tuck chin and swallow multiple times               Compensations: Chin tuck;Multiple dry swallows after each bite/sip   Postural Changes: Remain semi-upright after after feeds/meals (Comment);Seated upright at 90 degrees   Oral Care Recommendations: Oral care QID       Luanna Salk, MS [place] Pager [phone] Office [phone]  Macario Golds 12/13/2018,10:02 AM

## 2018-12-13 NOTE — Progress Notes (Signed)
Palliative care brief progress note  I met today with Christopher Mora in conjunction with his ex-wife and current girlfriend.  We discussed his clinical course over the past several months since he has been diagnosed with metastatic cancer and found to have recurrent aspiration following cardiac and respiratory arrest.   I discussed with him regarding heroic interventions at the end-of-life and he agrees work on getting him this would not be in line with prior expressed wishes for a natural death or be likely to lead to getting well enough to go back home. They were in agreement with changing CODE STATUS to DO NOT RESUSCITATE.  We have set up a meeting tomorrow at 1 PM with his son can be present for further conversation.  Micheline Rough, MD [place] Team [phone]  Full consult note to follow

## 2018-12-14 DIAGNOSIS — D72823 Leukemoid reaction: Secondary | ICD-10-CM

## 2018-12-14 DIAGNOSIS — Z515 Encounter for palliative care: Secondary | ICD-10-CM

## 2018-12-14 DIAGNOSIS — E43 Unspecified severe protein-calorie malnutrition: Secondary | ICD-10-CM

## 2018-12-14 DIAGNOSIS — Z7189 Other specified counseling: Secondary | ICD-10-CM

## 2018-12-14 DIAGNOSIS — D72829 Elevated white blood cell count, unspecified: Secondary | ICD-10-CM | POA: Diagnosis present

## 2018-12-14 DIAGNOSIS — R1314 Dysphagia, pharyngoesophageal phase: Secondary | ICD-10-CM

## 2018-12-14 DIAGNOSIS — J441 Chronic obstructive pulmonary disease with (acute) exacerbation: Secondary | ICD-10-CM | POA: Diagnosis present

## 2018-12-14 LAB — CULTURE, RESPIRATORY W GRAM STAIN

## 2018-12-14 LAB — CBC
HCT: 37.3 % — ABNORMAL LOW (ref 39.0–52.0)
Hemoglobin: 11.6 g/dL — ABNORMAL LOW (ref 13.0–17.0)
MCH: 30.4 pg (ref 26.0–34.0)
MCHC: 31.1 g/dL (ref 30.0–36.0)
MCV: 97.9 fL (ref 80.0–100.0)
Platelets: 329 10*3/uL (ref 150–400)
RBC: 3.81 MIL/uL — ABNORMAL LOW (ref 4.22–5.81)
RDW: 13.2 % (ref 11.5–15.5)
WBC: 20.1 10*3/uL — ABNORMAL HIGH (ref 4.0–10.5)
nRBC: 0 % (ref 0.0–0.2)

## 2018-12-14 LAB — COMPREHENSIVE METABOLIC PANEL
ALT: 31 U/L (ref 0–44)
AST: 39 U/L (ref 15–41)
Albumin: 2.2 g/dL — ABNORMAL LOW (ref 3.5–5.0)
Alkaline Phosphatase: 59 U/L (ref 38–126)
Anion gap: 10 (ref 5–15)
BUN: 34 mg/dL — ABNORMAL HIGH (ref 8–23)
CO2: 23 mmol/L (ref 22–32)
Calcium: 8.1 mg/dL — ABNORMAL LOW (ref 8.9–10.3)
Chloride: 105 mmol/L (ref 98–111)
Creatinine, Ser: 0.47 mg/dL — ABNORMAL LOW (ref 0.61–1.24)
GFR calc non Af Amer: >60 mL/min (ref >60)
Glucose, Bld: 122 mg/dL — ABNORMAL HIGH (ref 70–99)
Potassium: 4.2 mmol/L (ref 3.5–5.1)
Sodium: 138 mmol/L (ref 135–145)
Total Protein: 5.3 g/dL — ABNORMAL LOW (ref 6.5–8.1)

## 2018-12-14 LAB — CULTURE, BLOOD (ROUTINE X 2)
Culture: NO GROWTH
Culture: NO GROWTH
Special Requests: ADEQUATE
Special Requests: ADEQUATE

## 2018-12-14 LAB — COMPREHENSIVE METABOLIC PANEL WITH GFR
GFR calc Af Amer: >60 mL/min (ref >60)
Total Bilirubin: 0.7 mg/dL (ref 0.3–1.2)

## 2018-12-14 LAB — MAGNESIUM: Magnesium: 2.2 mg/dL (ref 1.7–2.4)

## 2018-12-14 MED ORDER — POLYVINYL ALCOHOL 1.4 % OP SOLN
1.0000 [drp] | Freq: Four times a day (QID) | OPHTHALMIC | Status: DC | PRN
  Filled 2018-12-14: qty 15

## 2018-12-14 MED ORDER — METOPROLOL TARTRATE 5 MG/5ML IV SOLN
5.0000 mg | INTRAVENOUS | Status: DC | PRN
  Administered 2018-12-14: 5 mg via INTRAVENOUS
  Filled 2018-12-14: qty 5

## 2018-12-14 MED ORDER — BIOTENE DRY MOUTH MT LIQD: 15.0000 mL | OROMUCOSAL | Status: DC | PRN

## 2018-12-14 MED ORDER — ACETAMINOPHEN 325 MG PO TABS: 650.0000 mg | ORAL_TABLET | Freq: Four times a day (QID) | ORAL | Status: DC | PRN

## 2018-12-14 MED ORDER — ACETAMINOPHEN 650 MG RE SUPP: 650.0000 mg | Freq: Four times a day (QID) | RECTAL | Status: DC | PRN

## 2018-12-14 MED ORDER — HALOPERIDOL 1 MG PO TABS: 0.5000 mg | ORAL_TABLET | ORAL | Status: DC | PRN

## 2018-12-14 MED ORDER — GLYCOPYRROLATE 0.2 MG/ML IJ SOLN: 0.2000 mg | INTRAMUSCULAR | Status: DC | PRN

## 2018-12-14 MED ORDER — ORAL CARE MOUTH RINSE
15.0000 mL | Freq: Two times a day (BID) | OROMUCOSAL | Status: DC
  Administered 2018-12-14 – 2018-12-16 (×5): 15 mL via OROMUCOSAL

## 2018-12-14 MED ORDER — ONDANSETRON HCL 4 MG/2ML IJ SOLN: 4.0000 mg | Freq: Four times a day (QID) | INTRAMUSCULAR | Status: DC | PRN

## 2018-12-14 MED ORDER — GLYCOPYRROLATE 1 MG PO TABS: 1.0000 mg | ORAL_TABLET | ORAL | Status: DC | PRN

## 2018-12-14 MED ORDER — LORAZEPAM 2 MG/ML PO CONC: 1.0000 mg | ORAL | Status: DC | PRN

## 2018-12-14 MED ORDER — HALOPERIDOL LACTATE 5 MG/ML IJ SOLN: 0.5000 mg | INTRAMUSCULAR | Status: DC | PRN

## 2018-12-14 MED ORDER — HYDROMORPHONE HCL 1 MG/ML IJ SOLN
0.2000 mg | INTRAMUSCULAR | Status: DC | PRN
  Administered 2018-12-14: 0.5 mg via INTRAVENOUS
  Filled 2018-12-14: qty 1

## 2018-12-14 MED ORDER — LORAZEPAM 2 MG/ML IJ SOLN: 1.0000 mg | INTRAMUSCULAR | Status: DC | PRN

## 2018-12-14 MED ORDER — HALOPERIDOL LACTATE 2 MG/ML PO CONC
0.5000 mg | ORAL | Status: DC | PRN
  Filled 2018-12-14: qty 0.3

## 2018-12-14 MED ORDER — LORAZEPAM 1 MG PO TABS: 1.0000 mg | ORAL_TABLET | ORAL | Status: DC | PRN

## 2018-12-14 MED ORDER — HYDROMORPHONE HCL 1 MG/ML IJ SOLN
0.5000 mg | INTRAMUSCULAR | Status: DC | PRN
  Administered 2018-12-14 – 2018-12-15 (×4): 1 mg via INTRAVENOUS
  Filled 2018-12-14 (×4): qty 1

## 2018-12-14 NOTE — Progress Notes (Signed)
Daily Progress Note   Patient Name: Christopher Mora       Date: 12/14/2018 DOB: [birth year]  Age: 68 y.o. MRN#: [id number] Attending Physician: Paticia Stack, MD Primary Care Physician: [name], MD Admit Date: 12/09/2018  Reason for Consultation/Follow-up: Establishing goals of care, Non pain symptom management and Pain control  Subjective: I met today with Christopher Mora and his family, including son, ex-wife, and girlfriend.   We discussed clinical course as well as wishes moving forward in regard to care plan this hospitalization.  We discussed difference between a aggressive medical intervention path and a palliative, comfort focused care path.   See below:  Length of Stay: 4  Current Medications: Scheduled Meds:  . arformoterol  15 mcg Nebulization BID  . budesonide (PULMICORT) nebulizer solution  0.5 mg Nebulization BID  . mouth rinse  15 mL Mouth Rinse BID  . methylPREDNISolone (SOLU-MEDROL) injection  40 mg Intravenous Q12H    Continuous Infusions: . sodium chloride 10 mL/hr at 12/14/18 1115    PRN Meds: sodium chloride, acetaminophen **OR** acetaminophen, antiseptic oral rinse, glycopyrrolate **OR** glycopyrrolate **OR** glycopyrrolate, haloperidol **OR** haloperidol **OR** haloperidol lactate, HYDROmorphone (DILAUDID) injection, levalbuterol, LORazepam **OR** LORazepam **OR** LORazepam, ondansetron (ZOFRAN) IV, polyvinyl alcohol  Physical Exam         General: Alert, awake, in mild/moderate respiratory distress HEENT: No bruits, no goiter, no JVD. Voice wet at times. Heart: Regular rate and rhythm. No murmur appreciated. Lungs: Fair air movement, diffuse rhonchi Abdomen: Soft, nontender, nondistended, positive bowel sounds.  Ext: No significant edema Skin: Warm  and dry Neuro: Grossly intact, nonfocal.   Vital Signs: BP (!) 152/88   Pulse 95   Temp 98.2 F (36.8 C) (Oral)   Resp 15   Ht 5' 8"  (1.727 m)   Wt 57.3 kg   SpO2 92%   BMI 19.21 kg/m  SpO2: SpO2: 92 % O2 Device: O2 Device: Nasal Cannula O2 Flow Rate: O2 Flow Rate (L/min): 3 L/min  Intake/output summary:   Intake/Output Summary (Last 24 hours) at 12/14/2018 1418 Last data filed at 12/14/2018 1411 Gross per 24 hour  Intake 1537.04 ml  Output 950 ml  Net 587.04 ml   LBM: Last BM Date: 12/14/18 Baseline Weight: Weight: 53.5 kg Most recent weight: Weight: 57.3 kg  Palliative Assessment/Data:    Flowsheet Rows     Most Recent Value  Intake Tab  Referral Department  Critical care  Unit at Time of Referral  ICU  Palliative Care Primary Diagnosis  Cancer  Date Notified  12/12/18  Palliative Care Type  New Palliative care  Reason for referral  Clarify Goals of Care  Date of Admission  12/09/18  # of days IP prior to Palliative referral  3  Clinical Assessment  Psychosocial & Spiritual Assessment  Palliative Care Outcomes      Patient Active Problem List   Diagnosis Date Noted  . Leukocytosis, due to steroid 12/14/2018  . Dysphagia, pharyngoesophageal 12/14/2018  . COPD with acute exacerbation ([place]) 12/14/2018  . Protein-calorie malnutrition, severe 12/11/2018  . Community acquired pneumonia of right upper lobe of lung ([place])   . Primary malignant neoplasm of lung metastatic to other site [facility])   . Acute respiratory failure with hypoxia ([place])   . Pneumonia due to suspected gram-negative bacteria 12/09/2018  . Adenocarcinoma of lung, stage 4, right ([place]) 12/09/2018  . Metastasis to nervous system and eye ([place]) 12/09/2018  . Tobacco abuse, in remission 12/09/2018  . Cachexia ([place]) 12/09/2018  . Acute dyspnea 11/15/2018  . Perforated diverticulitis 05/02/2012    Palliative Care Assessment & Plan   Assessment: 68 year old male with stage IV NSCLC and  respiratory failure secondary to PNA with aspiration  Recommendations/Plan:  Reviewed options with Christopher Mora and his family, including his ex-wife, girlfriend, and son.  Initially, we discussed plan to continue antibiotics and begin to explore options for possible discharge with hospice.  His family then asked me to return after they had discussed further and Christopher Mora reported that he was really only interested in comfort care moving forward.  He has a good understanding of his clinical situation and that he is going to decline regardless of interventions moving forward.  We discussed possibilities of hospital death vs working to transition to residential hospice based on his clinical course over the next day or two once comfort measures implemented.  Comfort orders updated using comfort care order set.  Discussed with Dr. Crisoforo Oxford.  Goals of Care and Additional Recommendations:  Limitations on Scope of Treatment: Full Comfort Care  Code Status:    Code Status Orders  (From admission, onward)         Start     Ordered   12/14/18 1407  Do not attempt resuscitation (DNR)  Continuous    Question Answer Comment  In the event of cardiac or respiratory ARREST Do not call a "code blue"   In the event of cardiac or respiratory ARREST Do not perform Intubation, CPR, defibrillation or ACLS   In the event of cardiac or respiratory ARREST Use medication by any route, position, wound care, and other measures to relive pain and suffering. May use oxygen, suction and manual treatment of airway obstruction as needed for comfort.      12/14/18 1409        Code Status History    Date Active Date Inactive Code Status Order ID Comments User Context   12/13/2018 1621 12/14/2018 1409 DNR [id number]  Micheline Rough, MD Inpatient   12/09/2018 1709 12/13/2018 1621 Full Code [id number]  Marcell Anger, MD Inpatient   11/15/2018 2253 11/16/2018 1704 Full Code [id number]  Welford Roche, MD  Inpatient   12/23/2012 1207 12/27/2012 1253 Full Code [id number]  Diamantina Monks, RN Inpatient   04/16/2012 0112 04/22/2012 1551  Full Code [id number]  Darliss Cheney, RN Inpatient       Prognosis:   Hours - Days most likely  Discharge Planning:  Anticipated Hospital Death vs residential hospice  Care plan was discussed with patient, family, Dr Crisoforo Oxford, RN  Thank you for allowing the Palliative Medicine Team to assist in the care of this patient.   Time In: 1300 Time Out: 1400 Total Time 60 Prolonged Time Billed No      Greater than 50%  of this time was spent counseling and coordinating care related to the above assessment and plan.  Micheline Rough, MD  Please contact Palliative Medicine Team phone at [phone] for questions and concerns.

## 2018-12-14 NOTE — Progress Notes (Signed)
PROGRESS NOTE    Christopher Mora  IRS:[id number] DOB: 1951/07/24 DOA: 12/09/2018 PCP: [name], MD   Brief Narrative:  68 year old with past medical history relevant for stage IV lung non-small cell adenocarcinoma with metastatic disease to the eye, COPD admitted on 12/09/2018 with acute hypoxic respiratory failure due to pneumonia and subsequently developed VT arrest on 12/11/2018 status post shock x1 and extubation on 12/12/2018. Palliative care consulted, meeting with family on 1/18, currently DNR.   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 (HCC)   Tobacco abuse, in remission   Cachexia (HCC)   Protein-calorie malnutrition, severe   Primary malignant neoplasm of lung metastatic to other site (HCC)   Leukocytosis, due to steroid   Dysphagia, pharyngoesophageal   COPD with acute exacerbation (HCC)   #) Acute hypoxic respiratory failure due to pneumonia: suspecting gram negative pneumonia given shock background, as well as in the setting of stage IV lung cancer -- continue nasal canula support; PRN nebs -Continue IV cefepime started 12/09/2018 (day #5) -Continue IV methylprednisolone 40 mg every 12 hours -Blood cultures on 12/09/2021 no growth to date   #) Respiratory arrest: Likely secondary pneumonia.  Patient did receive 1 shock due to possible VT/V. fib.  Troponins were flat.  CT on admission was negative -Echo showed normal EF with no wall motion normalities and grade 1 diastolic dysfunction -Extubated 12/12/2018 -- leukocytosis is likely due to steroid use, will monitor CBC   #) COPD exacerbation -Continue LABA/ICS -Continue PRN bronchodilators -IV steroids per above  #) Pharyngoesophageal dysphagia -- appreciate SPL eval and diet modification  #) Stage IV  adenocarcinoma: Widespread metastatic disease to spine, upper abdomen, thorax.  Patient is opted to transfer his care from [facility] due to closeness of area. -Dr. Earlie Server has seen the patient and at this time recommended palliative care --palliative care consulted and will arrange for family meeting today to discuss the goal of care. Pt is now DNR -Outpatient follow-up  #) moderate to severe protein calorie malnutrition: Likely secondary to cancer -Nutrition consult -Continue mirtazapine 50 mg nightly  Fluids: Gentle IV fluids Electrolyte: Monitor and supplement Nutrition: pending diet modification per SPL Prophylaxis: Subcu heparin   Disposition: Pending resolution of pneumonia and acute hypoxic respiratory failure  DNR    Consultants:   PCCM  Oncology  Palliative care  Procedures:   Intubation 12/11/2018, extubation 12/12/2018   Echo 12/12/2018 - Procedure narrative: Transthoracic echocardiography. Image   quality was poor. The study was technically difficult, as a   result of poor acoustic windows, poor sound wave transmission,   and restricted patient mobility. - Left ventricle: The cavity size was normal. Wall thickness was   normal. Systolic function was normal. The estimated ejection   fraction was in the range of 50% to 55%. Wall motion was normal;   there were no regional wall motion abnormalities. Doppler   parameters are consistent with abnormal left ventricular   relaxation (grade 1 diastolic dysfunction). - Atrial septum: There was an atrial septal aneurysm. - Pulmonary arteries: Systolic pressure was moderately increased.   PA peak pressure: 57 mm Hg (S). - Pericardium, extracardiac: A small pericardial effusion was   identified.  Impressions:  - Normal LV systolic function; mild diastolic dysfunction; mild TR    with moderate pulmonary hypertension;  small pericardial effusion.  Antimicrobials:  IV vancomycin 12/09/2020- 12/11/2018  IV cefepime  12/09/2020 ongoing   Subjective: This morning patient reports mild SOB and would like to have breathing treatment. He does have some sputum production that he does self-suction. He's on 3L [place].  He denies any chest pain, nausea, vomiting, diarrhea.  No fever, chills.  Objective: Vitals:   12/14/18 0400 12/14/18 0500 12/14/18 0600 12/14/18 0700  BP: (!) 181/89  (!) 175/96   Pulse: (!) 109 (!) 112 (!) 108 86  Resp: 19 (!) 21 19 18   Temp: 97.7 F (36.5 C)     TempSrc: Oral     SpO2: 94% 94% 96% 96%  Weight:      Height:        Intake/Output Summary (Last 24 hours) at 12/14/2018 0746 Last data filed at 12/14/2018 0515 Gross per 24 hour  Intake 870.33 ml  Output 1275 ml  Net -404.67 ml   Filed Weights   12/09/18 1032 12/12/18 0458  Weight: 53.5 kg 57.3 kg    Examination:  General exam: Appears calm and comfortable; chronic ill appearance with cachexia Respiratory system: Mildly labored in breathing, diffuse rhonchi, no wheezes Cardiovascular system: Regular rate and rhythm, no murmurs Gastrointestinal system: Soft, nontender, no rebound or guarding, plus bowel sounds Central nervous system: Alert and oriented.  Grossly intact, moving all extremities Extremities: No lower extremity edema Skin: No rashes over visible skin Psychiatry: Judgement and insight appear normal. Mood & affect appropriate.     Data Reviewed: I have personally reviewed following labs and imaging studies  CBC: Recent Labs  Lab 12/09/18 1035 12/10/18 0709 12/11/18 1158 12/12/18 0316 12/13/18 0304 12/14/18 0230  WBC 12.8* 13.1* 22.2* 11.6* 17.4* 20.1*  NEUTROABS 10.0* 10.1*  --   --   --   --   HGB 14.2 13.0 13.6 11.9* 11.0* 11.6*  HCT 45.1 41.6 45.2 38.5* 35.5* 37.3*  MCV 95.3 98.8 100.9* 98.7 99.4 97.9  PLT 527* 428* 502* 337 295 161   Basic Metabolic Panel: Recent Labs  Lab 12/10/18 0709 12/11/18 1158 12/11/18 1201 12/12/18 0316 12/13/18 0304 12/14/18 0230  NA 138 139  --  139 141 138   K 4.2 4.5  --  4.6 4.2 4.2  CL 102 104  --  108 109 105  CO2 25 24  --  21* 22 23  GLUCOSE 103* 110*  --  150* 121* 122*  BUN 24* 27*  --  32* 34* 34*  CREATININE 0.62 0.87  --  0.67 0.53* 0.47*  CALCIUM 8.7* 8.9  --  8.3* 8.3* 8.1*  MG  --   --  2.2 2.0  --  2.2  PHOS  --   --  5.5*  --   --   --    GFR: Estimated Creatinine Clearance: 72.6 mL/min (A) (by C-G formula based on SCr of 0.47 mg/dL (L)). Liver Function Tests: Recent Labs  Lab 12/09/18 1035 12/11/18 1158 12/11/18 1201 12/12/18 0316 12/14/18 0230  AST 37 44* 43* 33 39  ALT 43 30 29 26 31   ALKPHOS 88 78 77 58 59  BILITOT 0.9 0.9 0.8 0.5 0.7  PROT 6.6 6.1* 6.1* 5.1* 5.3*  ALBUMIN 2.7* 2.6* 2.6* 2.1* 2.2*   No results for input(s): LIPASE, AMYLASE in the last 168 hours. No results for input(s): AMMONIA in the last 168 hours. Coagulation Profile: Recent Labs  Lab 12/11/18 1158  INR 1.27   Cardiac Enzymes: Recent Labs  Lab 12/11/18  1158 12/11/18 1808 12/12/18 0024  TROPONINI 0.05* 0.04* 0.03*   BNP (last 3 results) No results for input(s): PROBNP in the last 8760 hours. HbA1C: No results for input(s): HGBA1C in the last 72 hours. CBG: Recent Labs  Lab 12/12/18 1937 12/12/18 2338 12/13/18 0419 12/13/18 0747 12/13/18 1138  GLUCAP 129* 97 120* 110* 120*   Lipid Profile: No results for input(s): CHOL, HDL, LDLCALC, TRIG, CHOLHDL, LDLDIRECT in the last 72 hours. Thyroid Function Tests: No results for input(s): TSH, T4TOTAL, FREET4, T3FREE, THYROIDAB in the last 72 hours. Anemia Panel: No results for input(s): VITAMINB12, FOLATE, FERRITIN, TIBC, IRON, RETICCTPCT in the last 72 hours. Sepsis Labs: Recent Labs  Lab 12/11/18 1158 12/11/18 1426  LATICACIDVEN 3.5* 3.1*    Recent Results (from the past 240 hour(s))  Culture, blood (routine x 2) Call MD if unable to obtain prior to antibiotics being given     Status: None (Preliminary result)   Collection Time: 12/09/18  5:10 PM  Result Value Ref  Range Status   Specimen Description   Final    BLOOD LEFT FOREARM Performed at [facility], [place] [address], [place], [place] [address]    Special Requests   Final    BOTTLES DRAWN AEROBIC AND ANAEROBIC Blood Culture adequate volume Performed at [place] [address], [place], [place] [address]    Culture   Final    NO GROWTH 4 DAYS Performed at [facility], [place] [address], [place], [place] [address]    Report Status PENDING  Incomplete  Culture, sputum-assessment     Status: None   Collection Time: 12/09/18  5:10 PM  Result Value Ref Range Status   Specimen Description SPUTUM  Final   Special Requests Immunocompromised  Final   Sputum evaluation   Final    THIS SPECIMEN IS ACCEPTABLE FOR SPUTUM CULTURE Performed at [facility], [place] [address], [place], [place] [address]    Report Status 12/10/2018 FINAL  Final  Culture, respiratory     Status: None (Preliminary result)   Collection Time: 12/09/18  5:10 PM  Result Value Ref Range Status   Specimen Description   Final    SPUTUM Performed at [place] [address] [place], [place] [address]    Special Requests   Final    Immunocompromised Reflexed from [id number] Performed at [facility], [place] [address] [place], [place] [address]    Gram Stain   Final    ABUNDANT WBC PRESENT,BOTH PMN AND MONONUCLEAR RARE GRAM POSITIVE COCCI FEW GRAM VARIABLE ROD FEW YEAST Performed at [facility], [place] [address], [place], [place] [address]    Culture FEW CANDIDA ALBICANS  Final   Report Status PENDING  Incomplete  Culture, blood (routine x 2) Call MD if unable to obtain prior to antibiotics being given     Status: None (Preliminary result)   Collection Time: 12/09/18  5:36 PM  Result Value Ref Range Status   Specimen Description   Final    BLOOD RIGHT ARM Performed at [facility] [place] [address], [place], [place] [address]    Special Requests   Final    BOTTLES DRAWN AEROBIC AND ANAEROBIC Blood Culture adequate volume Performed at [place] [address], [place], [place] [address]    Culture   Final    NO GROWTH 4 DAYS Performed at [facility], [place]  [address], [place], [place] [address]    Report Status PENDING  Incomplete  MRSA PCR Screening     Status: None   Collection Time: 12/10/18  1:04 PM  Result Value Ref Range Status   MRSA by PCR NEGATIVE NEGATIVE Final    Comment:        The GeneXpert MRSA Assay (FDA approved for NASAL specimens only), is one component of a comprehensive MRSA colonization surveillance program. It is not intended to diagnose MRSA infection nor to guide or monitor treatment for MRSA infections. Performed at [facility], [place] [address] [place],  [address]          Radiology Studies: Dg Chest Port 1 View  Result Date: 12/13/2018 CLINICAL DATA:  Respiratory failure EXAM: PORTABLE CHEST 1 VIEW COMPARISON:  12/12/2018 FINDINGS: Cardiac shadow is stable. Aortic calcifications are again seen. Endotracheal tube and nasogastric catheter have been removed in the interval. Hyperinflation is again seen. Increasing infiltrate is noted particularly in the right upper lobe. The left lung is clear but hyperinflated. No bony abnormality is seen. IMPRESSION: Increasing right upper lobe infiltrate. Electronically Signed   By: Inez Catalina M.D.   On: 12/13/2018 07:07   Dg Swallowing Func-speech Pathology  Result Date: 12/13/2018 Objective Swallowing Evaluation: Type of Study: MBS-Modified Barium Swallow Study  Patient Details Name: Corky Blumstein MRN: [id number] Date of Birth: 11-14-1951 Today's Date: 12/13/2018 Time: SLP Start Time (ACUTE ONLY): 0900 -SLP Stop Time (ACUTE ONLY): 0935 SLP Time Calculation (min) (ACUTE ONLY): 35 min Past Medical History: Past Medical History: Diagnosis Date . Asthma  .  Cancer ([place])  . Cough "for a while"  nonproductive . Pneumonia dec 2012 . Shortness of breath   occasional Past Surgical History: Past Surgical History: Procedure Laterality Date . APPENDECTOMY  04/15/2012  Procedure: APPENDECTOMY;  Surgeon: Edward Jolly, MD;  Location: [facility];  Service: General;; . CATARACT EXTRACTION   . COLON SURGERY   . COLOSTOMY  04/15/2012  Procedure: COLOSTOMY;  Surgeon: Edward Jolly, MD;  Location: [facility];  Service: General;; . COLOSTOMY TAKEDOWN  12/23/2012  Procedure: COLOSTOMY TAKEDOWN;  Surgeon: Edward Jolly, MD;  Location: [facility];  Service: General;  Laterality: N/A;  TAKEDOWN OF HARTMAN COLOSTOMY  . INTRAOCULAR LENS IMPLANT, SECONDARY   . KNEE SURGERY  1973  Left . KNEE SURGERY   . LAPAROTOMY  04/15/2012  Procedure: EXPLORATORY LAPAROTOMY;  Surgeon: Edward Jolly, MD;  Location: [facility];  Service: General;  Laterality: N/A; . PARTIAL COLECTOMY  04/15/2012  Procedure: PARTIAL COLECTOMY;  Surgeon: Edward Jolly, MD;  Location: [facility];  Service: General;;  sigmoid colectomy . SHOULDER SURGERY  2009 . SHOULDER SURGERY   HPI: 68 yo male admitted to [facility] with respiratory distress.  Pt PMH + for Stage IV lung cancer = diagnosed with right upper lobe pna = respiratory and cardiac arrest.   Subjective: pt awake in chair Assessment / Plan / Recommendation CHL IP CLINICAL IMPRESSIONS 12/13/2018 Clinical Impression Pt presents with mild oral and gross pharyngeal=cervical esophageal dysphagia characterized by minimal motility resulting in gross residuals across all consistencies.   Pt was only given few boluses of liquids and single bolus of pudding due to level of dysphagia.  Residuals predominantly vallecular region due to very poor tongue base retraction and epiglottic deflection with impaired sensation.  SLP questions primarily vagal and hypoglossal neve involvements - ? compression on recurrent laryngeal nerve from mass?  Pt only transited approximately 10% of boluses  into esophagus - large amount retained in pharynx/vallecular region precariously on top of the epiglottis.  Cued "hock" helpful for pt to expectorate but fatigued pt excessively.   He appeared more dyspneic today and he admitted this to be accurate.  Pt did not aspirate but given very poor pharyngeal motility = he is certainly aspirating even secretions at this time.  Multiple cued swallows with chin tuck helpful to decrease pyriform sinus residuals of liquids and secretions but were exhausting for pt to perform and did not clear vallecular region.  This pt's swallow is grossly impaired which prohibits him from nutritional support.  Note possible hospice referral - Recommend prior to goals of care, pt be allowed single SMALL ice chips - that he allows to melt fully with chin tuck posture conducting multiple swallows.  Ice chips willl enhance oral care and hopefully help to aid clearance of pharyngeal secretions.  If pt desires po with accepted risks, liquids may be most comforting for him given gross residuals and risk of aspirating solids.  Will follow up for education with this pt.   SLP Visit Diagnosis Dysphagia, oropharyngeal phase (R13.12);Dysphagia, pharyngoesophageal phase (R13.14) Attention and concentration deficit following -- Frontal lobe and executive function deficit following -- Impact on safety and function Risk for inadequate nutrition/hydration;Severe aspiration risk   CHL IP TREATMENT RECOMMENDATION 12/13/2018 Treatment Recommendations Therapy as outlined in treatment plan below   Prognosis 12/13/2018 Prognosis for Safe Diet Advancement Guarded Barriers to Reach Goals Severity of deficits;Time post onset;Other (Comment) Barriers/Prognosis Comment -- CHL IP DIET RECOMMENDATION 12/13/2018 SLP Diet Recommendations Ice chips PRN after oral care Liquid Administration via -- Medication Administration -- Compensations Chin tuck;Multiple dry swallows after each bite/sip Postural Changes Remain semi-upright  after after feeds/meals (Comment);Seated upright at 90 degrees   CHL IP OTHER RECOMMENDATIONS 12/13/2018 Recommended Consults -- Oral Care Recommendations Oral care QID Other Recommendations --   CHL IP FOLLOW UP RECOMMENDATIONS 12/12/2018 Follow up Recommendations (No Data)   CHL IP FREQUENCY AND DURATION 12/13/2018 Speech Therapy Frequency (ACUTE ONLY) min 1 x/week Treatment Duration 1 week;2 weeks      CHL IP ORAL PHASE 12/13/2018 Oral Phase Impaired Oral - Pudding Teaspoon -- Oral - Pudding Cup -- Oral - Honey Teaspoon -- Oral - Honey Cup -- Oral - Nectar Teaspoon Weak lingual manipulation Oral - Nectar Cup -- Oral - Nectar Straw Weak lingual manipulation Oral - Thin Teaspoon Weak lingual manipulation Oral - Thin Cup -- Oral - Thin Straw Weak lingual manipulation Oral - Puree Weak lingual manipulation Oral - Mech Soft Weak lingual manipulation Oral - Regular -- Oral - Multi-Consistency -- Oral - Pill -- Oral Phase - Comment --  CHL IP PHARYNGEAL PHASE 12/13/2018 Pharyngeal Phase Impaired Pharyngeal- Pudding Teaspoon -- Pharyngeal -- Pharyngeal- Pudding Cup -- Pharyngeal -- Pharyngeal- Honey Teaspoon -- Pharyngeal -- Pharyngeal- Honey Cup -- Pharyngeal -- Pharyngeal- Nectar Teaspoon Reduced epiglottic inversion;Reduced anterior laryngeal mobility;Reduced laryngeal elevation;Reduced airway/laryngeal closure;Reduced tongue base retraction;Pharyngeal residue - valleculae;Pharyngeal residue - pyriform;Pharyngeal residue - cp segment;Compensatory strategies attempted (with notebox) Pharyngeal -- Pharyngeal- Nectar Cup -- Pharyngeal -- Pharyngeal- Nectar Straw Reduced epiglottic inversion;Reduced pharyngeal peristalsis;Reduced laryngeal elevation;Reduced anterior laryngeal mobility;Reduced airway/laryngeal closure;Reduced tongue base retraction;Pharyngeal residue - pyriform;Pharyngeal residue - valleculae Pharyngeal -- Pharyngeal- Thin Teaspoon Reduced pharyngeal peristalsis;Reduced epiglottic inversion;Reduced anterior  laryngeal mobility;Reduced laryngeal elevation;Reduced airway/laryngeal closure;Reduced tongue base retraction;Pharyngeal residue - valleculae Pharyngeal -- Pharyngeal- Thin Cup -- Pharyngeal -- Pharyngeal- Thin Straw Reduced epiglottic inversion;Reduced anterior laryngeal mobility;Reduced laryngeal elevation;Reduced airway/laryngeal closure;Reduced tongue base  retraction;Pharyngeal residue - valleculae;Pharyngeal residue - pyriform Pharyngeal -- Pharyngeal- Puree Reduced laryngeal elevation;Reduced anterior laryngeal mobility;Reduced epiglottic inversion;Reduced pharyngeal peristalsis;Reduced airway/laryngeal closure;Reduced tongue base retraction;Pharyngeal residue - valleculae;Pharyngeal residue - pyriform;Pharyngeal residue - posterior pharnyx Pharyngeal -- Pharyngeal- Mechanical Soft -- Pharyngeal -- Pharyngeal- Regular -- Pharyngeal -- Pharyngeal- Multi-consistency -- Pharyngeal -- Pharyngeal- Pill -- Pharyngeal -- Pharyngeal Comment chin tuck posture minimally helpful, cued dry swallows and "hock" were exhausting for pt but inconsistently helpful, pt able to propel 50% of pudding residuals from vallecular region into oral cavity to orally suction, chin tuck posture with head turn right/left not helpful but chin tuck alone marginally helpful with liquids  CHL IP CERVICAL ESOPHAGEAL PHASE 12/13/2018 Cervical Esophageal Phase Impaired Pudding Teaspoon -- Pudding Cup -- Honey Teaspoon -- Honey Cup -- Nectar Teaspoon -- Nectar Cup -- Nectar Straw -- Thin Teaspoon -- Thin Cup -- Thin Straw -- Puree -- Mechanical Soft -- Regular -- Multi-consistency -- Pill -- Cervical Esophageal Comment very poor cricophayrngeus opening/relaxation resulting in gross residuals that mixed with secretions Macario Golds 12/13/2018, 10:03 AM Luanna Salk, MS [facility] SLP Acute Rehab Services Pager [phone] Office [phone]                   Scheduled Meds: . arformoterol  15 mcg Nebulization BID  . budesonide  (PULMICORT) nebulizer solution  0.5 mg Nebulization BID  . chlorhexidine  15 mL Mouth Rinse BID  . heparin injection (subcutaneous)  5,000 Units Subcutaneous Q8H  . mouth rinse  15 mL Mouth Rinse q12n4p  . Melatonin  10 mg Oral QHS  . methylPREDNISolone (SOLU-MEDROL) injection  40 mg Intravenous Q12H  . mirtazapine  15 mg Oral QHS  . ranitidine  150 mg Oral BID   Continuous Infusions: . sodium chloride 10 mL/hr at 12/13/18 1600  . sodium chloride 50 mL/hr at 12/13/18 1600  . ceFEPime (MAXIPIME) IV Stopped (12/14/18 0545)     LOS: 4 days    Time spent: [place], MD [facility]  If 7PM-7AM, please contact night-coverage [email or web address] Password [facility] 12/14/2018, 7:46 AM

## 2018-12-15 DIAGNOSIS — J690 Pneumonitis due to inhalation of food and vomit: Principal | ICD-10-CM

## 2018-12-15 DIAGNOSIS — R0600 Dyspnea, unspecified: Secondary | ICD-10-CM

## 2018-12-15 MED ORDER — HYDROMORPHONE HCL 1 MG/ML IJ SOLN
1.0000 mg | INTRAMUSCULAR | Status: DC | PRN
  Administered 2018-12-15: 1 mg via INTRAVENOUS
  Administered 2018-12-15: 1.5 mg via INTRAVENOUS
  Administered 2018-12-16 (×4): 1 mg via INTRAVENOUS
  Filled 2018-12-15: qty 1
  Filled 2018-12-15: qty 2
  Filled 2018-12-15 (×4): qty 1

## 2018-12-15 NOTE — Consult Note (Signed)
Consultation Note Date: 12/15/2018   Patient Name: Christopher Mora  DOB: 11/24/1951  MRN: [id number]  Age / Sex: 68 y.o., male  PCP: [name], MD Referring Physician: Paticia Stack, MD  Reason for Consultation: Establishing goals of care  HPI/Patient Profile: 68 y.o. male  with past medical history of Stage IV non-small cell lung cancer with metastatic disease to his eye, COPD admitted on 12/09/2018 with acute hypoxic respiratory failure secondary to pneumonia with subsequent development of VT arrest on 1/15.  He was intubated with extubation on 1/16.  He was then found to have recurrent aspiration on imaging by SLP.  Palliative consulted for goals of care.  Clinical Assessment and Goals of Care: I met today with Christopher Mora, his ex-wife, and his current girlfriend.  I introduced palliative care as specialized medical care for people living with serious illness. It focuses on providing relief from the symptoms and stress of a serious illness. The goal is to improve quality of life for both the patient and the family.  Christopher Mora reports that the most important things to him are his family including his son, Christopher Mora.    He reports that the doctors have been doing a good job talking with him about his current situation and when asked to relay what he they have been telling him he reported, "nothing good.  There is not really anything to do."  We had a long discussion regarding his clinical course over the past several months as well as potential pathways forward.  He understands that with his significant dysphasia and aspiration, there is not really a good fix for his overall situation.  We did discuss options of careful hand feeding as well as artificial nutrition.  Discussed how pursuing artificial nutrition would not eliminate risk of aspiration particularly due to the fact he is likely aspirating his  secretions.  He reports that this would be "delaying the inevitable" and not something he would be interested in pursuing.  I discussed with him regarding heroic interventions at the end-of-life and he agrees work on getting him this would not be in line with prior expressed wishes for a natural death or be likely to lead to getting well enough to go back home. They were in agreement with changing CODE STATUS to DO NOT RESUSCITATE.  SUMMARY OF RECOMMENDATIONS   -DNR/DNI -We had a long conversation regarding options moving forward.  He would like for his son to be part of conversation and we have made a follow-up meeting for tomorrow at 1 PM.  Code Status/Advance Care Planning:  DNR  Palliative Prophylaxis:   Aspiration and Frequent Pain Assessment  Additional Recommendations (Limitations, Scope, Preferences):  Continue current interventions.  No escalation of care  Psycho-social/Spiritual:   Desire for further Chaplaincy support:no  Additional Recommendations: Education on Hospice  Prognosis:   < 2 weeks  Discharge Planning: To Be Determined      Primary Diagnoses: Present on Admission: . Pneumonia due to suspected gram-negative bacteria . Adenocarcinoma of lung, stage  4, right ([place]) . Metastasis to nervous system and eye ([place]) . Cachexia ([place]) . Leukocytosis, due to steroid . Dysphagia, pharyngoesophageal . COPD with acute exacerbation ([place])   I have reviewed the medical record, interviewed the patient and family, and examined the patient. The following aspects are pertinent.  Past Medical History:  Diagnosis Date  . Asthma   . Cancer ([place])   . Cough "for a while"   nonproductive  . Pneumonia dec 2012  . Shortness of breath    occasional   Social History   Socioeconomic History  . Marital status: Divorced    Spouse name: Not on file  . Number of children: Not on file  . Years of education: Not on file  . Highest education level: Not on file    Occupational History  . Not on file  Social Needs  . Financial resource strain: Not on file  . Food insecurity:    Worry: Not on file    Inability: Not on file  . Transportation needs:    Medical: Not on file    Non-medical: Not on file  Tobacco Use  . Smoking status: Current Every Day Smoker    Packs/day: 5.00    Years: 40.00    Pack years: 200.00    Types: Cigars  . Smokeless tobacco: Never Used  Substance and Sexual Activity  . Alcohol use: No  . Drug use: Yes    Types: Marijuana    Comment: occasional marijuana  . Sexual activity: Never  Lifestyle  . Physical activity:    Days per week: Not on file    Minutes per session: Not on file  . Stress: Not on file  Relationships  . Social connections:    Talks on phone: Not on file    Gets together: Not on file    Attends religious service: Not on file    Active member of club or organization: Not on file    Attends meetings of clubs or organizations: Not on file    Relationship status: Not on file  Other Topics Concern  . Not on file  Social History Narrative  . Not on file   Family History  Problem Relation Age of Onset  . Alzheimer's disease Father    Scheduled Meds: . arformoterol  15 mcg Nebulization BID  . budesonide (PULMICORT) nebulizer solution  0.5 mg Nebulization BID  . mouth rinse  15 mL Mouth Rinse BID  . methylPREDNISolone (SOLU-MEDROL) injection  40 mg Intravenous Q12H   Continuous Infusions: . sodium chloride 10 mL/hr at 12/14/18 1115   PRN Meds:.sodium chloride, acetaminophen **OR** acetaminophen, antiseptic oral rinse, glycopyrrolate **OR** glycopyrrolate **OR** glycopyrrolate, haloperidol **OR** haloperidol **OR** haloperidol lactate, HYDROmorphone (DILAUDID) injection, levalbuterol, LORazepam **OR** LORazepam **OR** LORazepam, ondansetron (ZOFRAN) IV, polyvinyl alcohol Medications Prior to Admission:  Prior to Admission medications   Medication Sig Start Date End Date Taking? Authorizing  Provider  Fluticasone-Salmeterol (ADVAIR DISKUS) 250-50 MCG/DOSE AEPB Inhale 1 puff into the lungs 2 (two) times daily. 02/05/17  Yes [provider]  LORazepam (ATIVAN) 0.5 MG tablet Take 0.5 mg by mouth 3 (three) times daily as needed for anxiety. 11/16/17  Yes [provider]  Melatonin 10 MG TABS Take 10 mg by mouth at bedtime.   Yes [provider]  PROAIR HFA 108 (90 Base) MCG/ACT inhaler Inhale 2 puffs into the lungs every 6 (six) hours as needed for wheezing or shortness of breath. 01/01/18  Yes [provider]   Allergies  Allergen Reactions  . Oxycodone Nausea Only   Review of Systems  Constitutional: Positive for activity change, appetite change, fatigue and unexpected weight change.  HENT: Positive for congestion, trouble swallowing and voice change.   Respiratory: Positive for cough.   Musculoskeletal: Positive for arthralgias and back pain.  Neurological: Positive for weakness.  Psychiatric/Behavioral: Positive for sleep disturbance. The patient is nervous/anxious.    Physical Exam  General: Alert, awake, in no acute distress. Wet vocal quality. Heart: Regular rate and rhythm. No murmur appreciated. Lungs: Mild increased work of breathing, scattered crackles and rhonchi right greater than left Abdomen: Soft, nontender, nondistended, positive bowel sounds.  Ext: No significant edema Skin: Warm and dry Neuro: Grossly intact, nonfocal.  Vital Signs: BP 134/78 (BP Location: Left Arm)   Pulse (!) 102   Temp 97.6 F (36.4 C) (Axillary)   Resp 16   Ht 5' 8"  (1.727 m)   Wt 57.3 kg   SpO2 91%   BMI 19.21 kg/m  Pain Scale: 0-10   Pain Score: 0-No pain   SpO2: SpO2: 91 % O2 Device:SpO2: 91 % O2 Flow Rate: .O2 Flow Rate (L/min): 2 L/min  IO: Intake/output summary:   Intake/Output Summary (Last 24 hours) at 12/15/2018 0916 Last data filed at 12/15/2018 0700 Gross per 24 hour  Intake 1084.63 ml  Output 650 ml  Net 434.63 ml     LBM: Last BM Date: 12/14/18 Baseline Weight: Weight: 53.5 kg Most recent weight: Weight: 57.3 kg     Palliative Assessment/Data:   Flowsheet Rows     Most Recent Value  Intake Tab  Referral Department  Critical care  Unit at Time of Referral  ICU  Palliative Care Primary Diagnosis  Cancer  Date Notified  12/12/18  Palliative Care Type  New Palliative care  Reason for referral  Clarify Goals of Care  Date of Admission  12/09/18  Date first seen by Palliative Care  12/13/18  # of days Palliative referral response time  1 Day(s)  # of days IP prior to Palliative referral  3  Clinical Assessment  Psychosocial & Spiritual Assessment  Palliative Care Outcomes     Time in: 1500 Time out: 1600  Time Total: 60 Greater than 50%  of this time was spent counseling and coordinating care related to the above assessment and plan.  Signed by: Micheline Rough, MD   Please contact Palliative Medicine Team phone at [phone] for questions and concerns.  For individual provider: See Shea Evans

## 2018-12-15 NOTE — Progress Notes (Signed)
CSW consulted for residential hospice placement [facility]. Sent referral to hospice liaison, Raina Mina, who will f/u.   Pricilla Holm, MSW, [place] Social Work [phone]

## 2018-12-15 NOTE — Progress Notes (Signed)
[facility])  Received request from [place] for family interest in [facility]. Chart reviewed and met with patient's son Christopher Mora at bedside. Explained services, paper work and transfer process. Christopher Mora is aware [employer] was not able to offer room at time of visit. He is aware I will follow up tomorrow morning.   Thank you,  Erling Conte, LCSW [phone]

## 2018-12-15 NOTE — Progress Notes (Signed)
Daily Progress Note   Patient Name: Christopher Mora       Date: 12/15/2018 DOB: 05-Jan-1951  Age: 68 y.o. MRN#: [id number] Attending Physician: Paticia Stack, MD Primary Care Physician: [name], MD Admit Date: 12/09/2018  Reason for Consultation/Follow-up: Establishing goals of care, Non pain symptom management and Pain control  Subjective: I met today with Christopher Mora and his family, including son and ex-wife.   He reports still being SOB at times, but he finds the dilaudid to be very helpful.  He reports improvement with dose increase and requested to have it increased a little more.  Discussed possible transition to residential hospice.   See below:  Length of Stay: 5  Current Medications: Scheduled Meds:  . arformoterol  15 mcg Nebulization BID  . budesonide (PULMICORT) nebulizer solution  0.5 mg Nebulization BID  . mouth rinse  15 mL Mouth Rinse BID  . methylPREDNISolone (SOLU-MEDROL) injection  40 mg Intravenous Q12H    Continuous Infusions: . sodium chloride 500 mL (12/15/18 1417)    PRN Meds: sodium chloride, acetaminophen **OR** acetaminophen, antiseptic oral rinse, glycopyrrolate **OR** glycopyrrolate **OR** glycopyrrolate, haloperidol **OR** haloperidol **OR** haloperidol lactate, HYDROmorphone (DILAUDID) injection, levalbuterol, LORazepam **OR** LORazepam **OR** LORazepam, ondansetron (ZOFRAN) IV, polyvinyl alcohol  Physical Exam         General: Alert, awake, in mild/moderate respiratory distress HEENT: No bruits, no goiter, no JVD. Voice wet at times. Heart: Regular rate and rhythm. No murmur appreciated. Lungs: Fair air movement, diffuse rhonchi Abdomen: Soft, nontender, nondistended, positive bowel sounds.  Ext: No significant edema Skin: Warm and  dry Neuro: Grossly intact, nonfocal.   Vital Signs: BP (!) 161/104   Pulse (!) 118   Temp (!) 97.2 F (36.2 C) (Oral)   Resp 17   Ht _0  (1.727 m)   Wt 57.3 kg   SpO2 (!) 84%   BMI 19.21 kg/m  SpO2: SpO2: (!) 84 % O2 Device: O2 Device: Nasal Cannula O2 Flow Rate: O2 Flow Rate (L/min): 2 L/min  Intake/output summary:   Intake/Output Summary (Last 24 hours) at 12/15/2018 2057 Last data filed at 12/15/2018 2009 Gross per 24 hour  Intake 567.1 ml  Output 1000 ml  Net -432.9 ml   LBM: Last BM Date: 12/14/18 Baseline Weight: Weight: 53.5 kg Most recent weight: Weight: 57.3  kg       Palliative Assessment/Data:    Flowsheet Rows     Most Recent Value  Intake Tab  Referral Department  Critical care  Unit at Time of Referral  ICU  Palliative Care Primary Diagnosis  Cancer  Date Notified  12/12/18  Palliative Care Type  New Palliative care  Reason for referral  Clarify Goals of Care  Date of Admission  12/09/18  Date first seen by Palliative Care  12/13/18  # of days Palliative referral response time  1 Day(s)  # of days IP prior to Palliative referral  3  Clinical Assessment  Psychosocial & Spiritual Assessment  Palliative Care Outcomes      Patient Active Problem List   Diagnosis Date Noted  . Leukocytosis, due to steroid 12/14/2018  . Dysphagia, pharyngoesophageal 12/14/2018  . COPD with acute exacerbation ([place]) 12/14/2018  . Protein-calorie malnutrition, severe 12/11/2018  . Community acquired pneumonia of right upper lobe of lung ([place])   . Primary malignant neoplasm of lung metastatic to other site [facility])   . Acute respiratory failure with hypoxia ([place])   . Pneumonia due to suspected gram-negative bacteria 12/09/2018  . Adenocarcinoma of lung, stage 4, right ([place]) 12/09/2018  . 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 Tobacco abuse, in remission 12/09/2018  . Cachexia ([place]) 12/09/2018  . Acute dyspnea 11/15/2018  . Perforated  diverticulitis 05/02/2012    Palliative Care Assessment & Plan   Assessment: 69 year old male with stage IV NSCLC and respiratory failure secondary to PNA with aspiration  Recommendations/Plan:  Full comfort care.  Increased dilaudid for SOB.  Plan for referral to [facility] for end of life care.  Referral placed to social work.  Discussed with Dr. Crisoforo Oxford.  Goals of Care and Additional Recommendations:  Limitations on Scope of Treatment: Full Comfort Care  Code Status:    Code Status Orders  (From admission, onward)         Start     Ordered   12/14/18 1407  Do not attempt resuscitation (DNR)  Continuous    Question Answer Comment  In the event of cardiac or respiratory ARREST Do not call a "code blue"   In the event of cardiac or respiratory ARREST Do not perform Intubation, CPR, defibrillation or ACLS   In the event of cardiac or respiratory ARREST Use medication by any route, position, wound care, and other measures to relive pain and suffering. May use oxygen, suction and manual treatment of airway obstruction as needed for comfort.      12/14/18 1409        Code Status History    Date Active Date Inactive Code Status Order ID Comments User Context   12/13/2018 1621 12/14/2018 1409 DNR [id number]  Micheline Rough, MD Inpatient   12/09/2018 1709 12/13/2018 1621 Full Code [id number]  Marcell Anger, MD Inpatient   11/15/2018 2253 11/16/2018 1704 Full Code [id number]  Welford Roche, MD Inpatient   12/23/2012 1207 12/27/2012 1253 Full Code [id number]  Diamantina Monks, RN Inpatient   04/16/2012 0112 04/22/2012 1551 Full Code [id number]  Darliss Cheney, RN Inpatient       Prognosis:   Hours - Days most likely  Discharge Planning:  Anticipated Hospital Death vs residential hospice  Care plan was discussed with patient, family, Dr Crisoforo Oxford, RN  Thank you for allowing the Palliative Medicine Team to assist in the care of this patient.   Total Time 30  Prolonged  Time Billed No      Greater than 50%  of this time was spent counseling and coordinating care related to the above assessment and plan.  Micheline Rough, MD  Please contact Palliative Medicine Team phone at [phone] for questions and concerns.

## 2018-12-15 NOTE — Progress Notes (Signed)
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 Palliative care consulted, meeting with family on 1/18 and 1/19, recommending residential hospice, pending discharge. 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 (HCC)   Tobacco abuse, in remission   Cachexia (HCC)   Protein-calorie malnutrition, severe   Primary malignant neoplasm of lung metastatic to other site (HCC)   Leukocytosis, due to steroid   Dysphagia, pharyngoesophageal   COPD with acute exacerbation (HCC)   #) Acute hypoxic respiratory failure due to pneumonia: suspecting gram negative pneumonia vs aspiration pneumonia -- continue 2L nasal canula support; PRN nebs -Continue IV cefepime started 12/09/2018 (day #6) -Continue IV methylprednisolone 40 mg every 12 hours -Blood cultures on 12/09/2021 no growth to date --all above medications will be stopped once pt is discharged to IP hospice  #) Respiratory arrest: Likely secondary pneumonia.  Patient did receive 1 shock due to possible VT/V. fib.  Troponins were flat.  CT on admission was negative -Echo showed normal EF with no wall motion normalities and grade 1 diastolic dysfunction -Extubated 12/12/2018 -- leukocytosis is likely due to steroid use, will monitor CBC   #) COPD exacerbation -Continue LABA/ICS -Continue PRN bronchodilators -IV  steroids per above  #) Pharyngoesophageal dysphagia -- appreciate SPL eval and diet modification --pt can develop aspiration pneumonia any time  #) Stage IV adenocarcinoma: Widespread metastatic disease to spine, upper abdomen, thorax.  Patient is opted to transfer his care from [place] due to closeness of area. -Dr. Earlie Server has seen the patient and at this time recommended palliative care --palliative care consulted and recommended residential hospice; pt is not actively dying at present   #) moderate to severe protein calorie malnutrition: Likely secondary to cancer -Nutrition consult -Continue mirtazapine 50 mg nightly  Fluids: Gentle IV fluids Electrolyte: Monitor and supplement Nutrition: pending diet modification per SPL Prophylaxis: Subcu heparin   Disposition: initiated process for residential hospice referral; expecting discharge to hospice today or tomorrow.   DNR    Consultants:   PCCM  Oncology  Palliative care  Procedures:   Intubation 12/11/2018, extubation 12/12/2018   Echo 12/12/2018 - Procedure narrative: Transthoracic echocardiography. Image   quality was poor. The study was technically difficult, as a   result of poor acoustic windows, poor sound wave transmission,   and restricted patient mobility. - Left ventricle: The cavity size was normal. Wall thickness was   normal. Systolic function was normal. The estimated ejection   fraction was in the range of 50% to 55%. Wall motion was normal;   there were no regional wall motion abnormalities. Doppler   parameters are consistent with abnormal left ventricular   relaxation (grade 1 diastolic dysfunction). - Atrial septum: There was an atrial septal aneurysm. - Pulmonary arteries: Systolic pressure was moderately increased.   PA peak pressure: 57 mm Hg (S). - Pericardium, extracardiac: A small pericardial effusion was   identified.  Impressions:  -  Normal LV systolic function; mild diastolic  dysfunction; mild TR    with moderate pulmonary hypertension; small pericardial effusion.  Antimicrobials:  IV vancomycin 12/09/2020- 12/11/2018  IV cefepime 12/09/2020 ongoing   Subjective: Still having mild SOB at rest. He does have some sputum production that he does self-suction. He's on 2L [place].  He denies any chest pain, nausea, vomiting, diarrhea.  No fever, chills. Pt and family agreed with hospice care plan  Objective: Vitals:   12/14/18 1931 12/15/18 0714 12/15/18 0800 12/15/18 1009  BP:   134/78   Pulse:  (!) 102 (!) 102 (!) 104  Resp:  15 16 19   Temp: 97.8 F (36.6 C)  97.6 F (36.4 C)   TempSrc: Axillary  Axillary   SpO2:  (!) 89% 91% 90%  Weight:      Height:        Intake/Output Summary (Last 24 hours) at 12/15/2018 1215 Last data filed at 12/15/2018 1010 Gross per 24 hour  Intake 0 ml  Output 950 ml  Net -950 ml   Filed Weights   12/09/18 1032 12/12/18 0458  Weight: 53.5 kg 57.3 kg    Examination:  General exam: Appears calm and comfortable; chronic ill appearance with cachexia Respiratory system: Mildly labored in breathing, diffuse rhonchi, no wheezes Cardiovascular system: Regular rate and rhythm, no murmurs Gastrointestinal system: Soft, nontender, no rebound or guarding, plus bowel sounds Central nervous system: Alert and oriented.  Grossly intact, moving all extremities Extremities: No lower extremity edema Skin: No rashes over visible skin Psychiatry: Judgement and insight appear normal. Mood & affect appropriate.     Data Reviewed: I have personally reviewed following labs and imaging studies  CBC: Recent Labs  Lab 12/09/18 1035 12/10/18 0709 12/11/18 1158 12/12/18 0316 12/13/18 0304 12/14/18 0230  WBC 12.8* 13.1* 22.2* 11.6* 17.4* 20.1*  NEUTROABS 10.0* 10.1*  --   --   --   --   HGB 14.2 13.0 13.6 11.9* 11.0* 11.6*  HCT 45.1 41.6 45.2 38.5* 35.5* 37.3*  MCV 95.3 98.8 100.9* 98.7 99.4 97.9  PLT 527* 428* 502* 337 295 338    Basic Metabolic Panel: Recent Labs  Lab 12/10/18 0709 12/11/18 1158 12/11/18 1201 12/12/18 0316 12/13/18 0304 12/14/18 0230  NA 138 139  --  139 141 138  K 4.2 4.5  --  4.6 4.2 4.2  CL 102 104  --  108 109 105  CO2 25 24  --  21* 22 23  GLUCOSE 103* 110*  --  150* 121* 122*  BUN 24* 27*  --  32* 34* 34*  CREATININE 0.62 0.87  --  0.67 0.53* 0.47*  CALCIUM 8.7* 8.9  --  8.3* 8.3* 8.1*  MG  --   --  2.2 2.0  --  2.2  PHOS  --   --  5.5*  --   --   --    GFR: Estimated Creatinine Clearance: 72.6 mL/min (A) (by C-G formula based on SCr of 0.47 mg/dL (L)). Liver Function Tests: Recent Labs  Lab 12/09/18 1035 12/11/18 1158 12/11/18 1201 12/12/18 0316 12/14/18 0230  AST 37 44* 43* 33 39  ALT 43 30 29 26 31   ALKPHOS 88 78 77 58 59  BILITOT 0.9 0.9 0.8 0.5 0.7  PROT 6.6 6.1* 6.1* 5.1* 5.3*  ALBUMIN 2.7* 2.6* 2.6* 2.1* 2.2*   No results for input(s): LIPASE, AMYLASE in the last 168 hours. No results for input(s): AMMONIA in the last 168 hours. Coagulation Profile:  Recent Labs  Lab 12/11/18 1158  INR 1.27   Cardiac Enzymes: Recent Labs  Lab 12/11/18 1158 12/11/18 1808 12/12/18 0024  TROPONINI 0.05* 0.04* 0.03*   BNP (last 3 results) No results for input(s): PROBNP in the last 8760 hours. HbA1C: No results for input(s): HGBA1C in the last 72 hours. CBG: Recent Labs  Lab 12/12/18 1937 12/12/18 2338 12/13/18 0419 12/13/18 0747 12/13/18 1138  GLUCAP 129* 97 120* 110* 120*   Lipid Profile: No results for input(s): CHOL, HDL, LDLCALC, TRIG, CHOLHDL, LDLDIRECT in the last 72 hours. Thyroid Function Tests: No results for input(s): TSH, T4TOTAL, FREET4, T3FREE, THYROIDAB in the last 72 hours. Anemia Panel: No results for input(s): VITAMINB12, FOLATE, FERRITIN, TIBC, IRON, RETICCTPCT in the last 72 hours. Sepsis Labs: Recent Labs  Lab 12/11/18 1158 12/11/18 1426  LATICACIDVEN 3.5* 3.1*    Recent Results (from the past 240 hour(s))  Culture, blood  (routine x 2) Call MD if unable to obtain prior to antibiotics being given     Status: None   Collection Time: 12/09/18  5:10 PM  Result Value Ref Range Status   Specimen Description   Final    BLOOD LEFT FOREARM Performed at [facility], [place] [address], [place], [place] [address]    Special Requests   Final    BOTTLES DRAWN AEROBIC AND ANAEROBIC Blood Culture adequate volume Performed at [place] [address], [place], [place] [address]    Culture   Final    NO GROWTH 5 DAYS Performed at [facility], [place] [address] [place], [place] [address]    Report Status 12/14/2018 FINAL  Final  Culture, sputum-assessment     Status: None   Collection Time: 12/09/18  5:10 PM  Result Value Ref Range Status   Specimen Description SPUTUM  Final   Special Requests Immunocompromised  Final   Sputum evaluation   Final    THIS SPECIMEN IS ACCEPTABLE FOR SPUTUM CULTURE Performed at [facility], [place] [address], [place], [place] [address]    Report Status 12/10/2018 FINAL  Final  Culture, respiratory     Status: None   Collection Time: 12/09/18  5:10 PM  Result Value Ref Range Status   Specimen Description   Final    SPUTUM Performed at [place] [address], [place], [place] [address]    Special Requests   Final    Immunocompromised Reflexed from [phone] Performed at [facility], [place] [address] [place], [place] [address]    Gram Stain   Final    ABUNDANT WBC PRESENT,BOTH PMN AND MONONUCLEAR RARE GRAM POSITIVE COCCI FEW GRAM VARIABLE ROD FEW YEAST Performed at [facility], [place] [address], [place], [place] [address]    Culture FEW CANDIDA TROPICALIS FEW CANDIDA ALBICANS   Final   Report Status 12/14/2018 FINAL  Final  Culture, blood (routine x 2) Call MD if unable to obtain prior to antibiotics being given     Status: None   Collection Time: 12/09/18  5:36 PM   Result Value Ref Range Status   Specimen Description   Final    BLOOD RIGHT ARM Performed at [facility], [place] [address] [place], [place] [address]    Special Requests   Final    BOTTLES DRAWN AEROBIC AND ANAEROBIC Blood Culture adequate volume Performed at [place] [address] [place], [place] [address]    Culture  Final    NO GROWTH 5 DAYS Performed at [facility], [place] [address], [place], [place] [address]    Report Status 12/14/2018 FINAL  Final  MRSA PCR Screening     Status: None   Collection Time: 12/10/18  1:04 PM  Result Value Ref Range Status   MRSA by PCR NEGATIVE NEGATIVE Final    Comment:        The GeneXpert MRSA Assay (FDA approved for NASAL specimens only), is one component of a comprehensive MRSA colonization surveillance program. It is not intended to diagnose MRSA infection nor to guide or monitor treatment for MRSA infections. Performed at [facility], [place] [address], [place], [place] [address]          Radiology Studies: No results found.      Scheduled Meds: . arformoterol  15 mcg Nebulization BID  . budesonide (PULMICORT) nebulizer solution  0.5 mg Nebulization BID  . mouth rinse  15 mL Mouth Rinse BID  . methylPREDNISolone (SOLU-MEDROL) injection  40 mg Intravenous Q12H   Continuous Infusions: . sodium chloride 10 mL/hr at 12/14/18 1115     LOS: 5 days    Time spent: [place], MD [facility]  If 7PM-7AM, please contact night-coverage [email or web address] Password [facility] 12/15/2018, 12:15 PM

## 2018-12-16 DIAGNOSIS — J9601 Acute respiratory failure with hypoxia: Secondary | ICD-10-CM

## 2018-12-16 NOTE — Progress Notes (Signed)
Daily Progress Note   Patient Name: Christopher Mora       Date: 12/16/2018 DOB: 06-Mar-1951  Age: 68 y.o. MRN#: [id number] Attending Physician: Cristy Folks, MD Primary Care Physician: [name], MD Admit Date: 12/09/2018  Reason for Consultation/Follow-up: Establishing goals of care, Non pain symptom management and Pain control  Subjective: I met today with [name].   He reports still being SOB at times, but he finds the dilaudid to be very helpful.    He is still having difficulty with secretions and he will cough up phlegm and self suction frequently.    Only PO intake has been a sip or two to moisten mouth and sip for the taste of a Coke.  Discussed possible transition to residential hospice.   See below:  Length of Stay: 6  Current Medications: Scheduled Meds:  . arformoterol  15 mcg Nebulization BID  . budesonide (PULMICORT) nebulizer solution  0.5 mg Nebulization BID  . mouth rinse  15 mL Mouth Rinse BID  . methylPREDNISolone (SOLU-MEDROL) injection  40 mg Intravenous Q12H    Continuous Infusions: . sodium chloride 500 mL (12/15/18 1417)    PRN Meds: sodium chloride, acetaminophen **OR** acetaminophen, antiseptic oral rinse, glycopyrrolate **OR** glycopyrrolate **OR** glycopyrrolate, haloperidol **OR** haloperidol **OR** haloperidol lactate, HYDROmorphone (DILAUDID) injection, levalbuterol, LORazepam **OR** LORazepam **OR** LORazepam, ondansetron (ZOFRAN) IV, polyvinyl alcohol  Physical Exam         General: Alert, awake, in mild/moderate respiratory distress HEENT: No bruits, no goiter, no JVD. Voice wet at times. Heart: Regular rate and rhythm. No murmur appreciated. Lungs: Fair air movement, diffuse rhonchi Abdomen: Soft, nontender, nondistended,  positive bowel sounds.  Ext: No significant edema Skin: Warm and dry Neuro: Grossly intact, nonfocal.   Vital Signs: BP (!) 161/104   Pulse (!) 118   Temp (!) 97.2 F (36.2 C) (Oral)   Resp 17   Ht 5' 8"  (1.727 m)   Wt 57.3 kg   SpO2 (!) 84%   BMI 19.21 kg/m  SpO2: SpO2: (!) 84 % O2 Device: O2 Device: Nasal Cannula O2 Flow Rate: O2 Flow Rate (L/min): 2 L/min  Intake/output summary:   Intake/Output Summary (Last 24 hours) at 12/16/2018 0949 Last data filed at 12/16/2018 0236 Gross per 24 hour  Intake 567.1 ml  Output 700 ml  Net -132.9 ml   LBM: Last BM Date: 12/14/18 Baseline Weight: Weight: 53.5 kg Most recent weight: Weight: 57.3 kg       Palliative Assessment/Data:    Flowsheet Rows     Most Recent Value  Intake Tab  Referral Department  Critical care  Unit at Time of Referral  ICU  Palliative Care Primary Diagnosis  Cancer  Date Notified  12/12/18  Palliative Care Type  New Palliative care  Reason for referral  Clarify Goals of Care  Date of Admission  12/09/18  Date first seen by Palliative Care  12/13/18  # of days Palliative referral response time  1 Day(s)  # of days IP prior to Palliative referral  3  Clinical Assessment  Psychosocial & Spiritual Assessment  Palliative Care Outcomes      Patient Active Problem List   Diagnosis Date Noted  . Leukocytosis, due to steroid 12/14/2018  . Dysphagia, pharyngoesophageal 12/14/2018  . COPD with acute exacerbation ([place]) 12/14/2018  . Protein-calorie malnutrition, severe 12/11/2018  . Community acquired pneumonia of right upper lobe of lung ([place])   . Primary malignant neoplasm of lung metastatic to other site [facility])   . Acute respiratory failure with hypoxia ([place])   . Pneumonia due to suspected gram-negative bacteria 12/09/2018  . Adenocarcinoma of lung, stage 4, right ([place]) 12/09/2018  . Metastasis to nervous system and eye ([place]) 12/09/2018  . Tobacco abuse, in remission 12/09/2018  . Cachexia ([place])  12/09/2018  . Acute dyspnea 11/15/2018  . Perforated diverticulitis 05/02/2012    Palliative Care Assessment & Plan   Assessment: 68 year old male with stage IV NSCLC and respiratory failure secondary to PNA with aspiration  Recommendations/Plan:  Full comfort care.  Continue dilaudid for SOB.  Plan for transition to residential hospice for end of life care.  If no bed available today, I discussed with him that he will likely transfer out of ICU to medical floor.  Goals of Care and Additional Recommendations:  Limitations on Scope of Treatment: Full Comfort Care  Code Status:    Code Status Orders  (From admission, onward)         Start     Ordered   12/14/18 1407  Do not attempt resuscitation (DNR)  Continuous    Question Answer Comment  In the event of cardiac or respiratory ARREST Do not call a "code blue"   In the event of cardiac or respiratory ARREST Do not perform Intubation, CPR, defibrillation or ACLS   In the event of cardiac or respiratory ARREST Use medication by any route, position, wound care, and other measures to relive pain and suffering. May use oxygen, suction and manual treatment of airway obstruction as needed for comfort.      12/14/18 1409        Code Status History    Date Active Date Inactive Code Status Order ID Comments User Context   12/13/2018 1621 12/14/2018 1409 DNR [id number]  Micheline Rough, MD Inpatient   12/09/2018 1709 12/13/2018 1621 Full Code [id number]  Marcell Anger, MD Inpatient   11/15/2018 2253 11/16/2018 1704 Full Code [id number]  Welford Roche, MD Inpatient   12/23/2012 1207 12/27/2012 1253 Full Code [id number]  Diamantina Monks, RN Inpatient   04/16/2012 0112 04/22/2012 1551 Full Code [id number]  Darliss Cheney, RN Inpatient       Prognosis:   Hours - Days most likely  Discharge Planning:  Anticipated Hospital Death vs residential hospice  Care plan was discussed  with patient.  Thank you for allowing  the Palliative Medicine Team to assist in the care of this patient.   Total Time 20 Prolonged Time Billed No      Greater than 50%  of this time was spent counseling and coordinating care related to the above assessment and plan.  Micheline Rough, MD  Please contact Palliative Medicine Team phone at [phone] for questions and concerns.

## 2018-12-16 NOTE — Discharge Instructions (Signed)
Hospice °Hospice is a service that is designed to provide people who are terminally ill and their families with medical, spiritual, and psychological support. Its aim is to improve your quality of life by keeping you as comfortable as possible in the final stages of life. °Who will be my providers when I begin hospice care? °Hospice teams often include: °· A nurse. °· A doctor. The hospice doctor will be available for your care, but you can include your regular doctor or nurse practitioner. °· A social worker. °· A counselor. °· A religious leader (such as a chaplain). °· A dietitian. °· Therapists. °· Trained volunteers who can help with care. °What services does hospice provide? °Hospice services can vary depending on the center or organization. Generally, they include: °· Ways to keep you comfortable, such as: °? Providing care in your home or in a home-like setting. °? Working with your family and friends to help meet your needs. °? Allowing you to enjoy the support of loved ones by receiving much of your basic care from family and friends. °· Pain relief and symptom management. The staff will supply all necessary medicines and equipment so that you can stay comfortable and alert enough to enjoy the company of your friends and family. °· Visits or care from a nurse and doctor. This may include 24-hour on-call services. °· Companionship when you are alone. °· Allowing you and your family to rest. Hospice staff may do light housekeeping, prepare meals, and run errands. °· Counseling. They will make sure your emotional, spiritual, and social needs are being met, as well as those needs of your family members. °· Spiritual care. This will be individualized to meet your needs and your family's needs. It may involve: °? Helping you and your family understand the dying process. °? Helping you say goodbye to your family and friends. °? Performing a specific religious ceremony or ritual. °· Massage. °· Nutrition  therapy. °· Physical and occupational therapy. °· Short-term inpatient care, if something cannot be managed in the home. °· Art or music therapy. °· Bereavement support for grieving family members. °When should hospice care begin? °Most people who use hospice are believed to have less than 6 months to live. °· Your family and health care providers can help you decide when hospice services should begin. °· If you live longer than 6 months but your condition does not improve, your doctor may be able to approve you for continued hospice care. °· If your condition improves, you may discontinue the program. °What should I consider before selecting a program? °Most hospice programs are run by nonprofit, independent organizations. Some are affiliated with hospitals, nursing homes, or home health care agencies. Hospice programs can take place in your home or at a hospice center, hospital, or skilled nursing facility. When choosing a hospice program, ask the following questions: °· What services are available to me? °· What services will be offered to my loved ones? °· How involved will my loved ones be? °· How involved will my health care provider be? °· Who makes up the hospice care team? How are they trained or screened? °· How will my pain and symptoms be managed? °· If my circumstances change, can the services be provided in a different setting, such as my home or in the hospital? °· Is the program reviewed and licensed by the state or certified in some other way? °· What does it cost? Is it covered by insurance? °· If I choose a hospice   center or nursing home, where is the hospice center located? Is it convenient for family and friends? °· If I choose a hospice center or nursing home, can my family and friends visit any time? °· Will you provide emotional and spiritual support? °· Who can my family call with questions? °Where can I learn more about hospice? °You can learn about existing hospice programs in your area  from your health care providers. You can also read more about hospice online. The websites of the following organizations have helpful information: °· National Hospice and Palliative Care Organization (NHPCO): www.nhpco.org °· National Association for Home Care & Hospice (NAHC): www.nahc.org °· Hospice Foundation of America (HFA): www.hospicefoundation.org °· American Cancer Society (ACS): www.cancer.org °· Hospice Net: www.hospicenet.org °· Visiting Nurse Associations of America (VNAA): www.vnaa.org °You may also find more information by contacting the following agencies: °· A local agency on aging. °· Your local [employer] Way chapter. °· Your state's department of health or social services. °Summary °· Hospice is a service that is designed to provide people who are terminally ill and their families with medical, spiritual, and psychological support. °· Hospice aims to improve your quality of life by keeping you as comfortable as possible in the final stages of life. °· Hospice teams often include a doctor, nurse, social worker, counselor, religious leader,dietitian, therapists, and volunteers. °· Hospice care generally includes medicine for symptom management, visits from doctors and nurses, physical and occupational therapy, nutrition counseling, spiritual and emotional counseling, caregiver support, and bereavement support for grieving family members. °· Hospice programs can take place in your home or at a hospice center, hospital, or skilled nursing facility. °This information is not intended to replace advice given to you by your health care provider. Make sure you discuss any questions you have with your health care provider. °Document Released: 03/01/2004 Document Revised: 12/05/2016 Document Reviewed: 12/05/2016 °Elsevier Interactive Patient Education © 2019 [employer] ° °

## 2018-12-16 NOTE — Progress Notes (Signed)
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 Palliative care consulted, and family is pending discharge to residential hospice.   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 (HCC)   Tobacco abuse, in remission   Cachexia (HCC)   Protein-calorie malnutrition, severe   Primary malignant neoplasm of lung metastatic to other site (HCC)   Leukocytosis, due to steroid   Dysphagia, pharyngoesophageal   COPD with acute exacerbation (HCC)  #) Stage IV adenocarcinoma: Widespread metastatic disease to spine, upper abdomen, thorax.   -Dr. Earlie Server has seen the patient and at this time recommended palliative care --palliative care consulted and recommended residential hospice  #) Acute hypoxic respiratory failure due to pneumonia due to pharyngeal dysphasia: Likely due to aspiration versus postobstructive pneumonia. -Continue 2L nasal canula support; PRN nebs -Continue IV cefepime started 12/09/2018 (day #7), will discontinue today -Continue IV methylprednisolone 40 mg every 12 hours -Blood cultures on 12/09/2021 negative -Speech-language pathology following, modified barium swallow grossly abnormal - P.o. for comfort  #) Respiratory arrest: Likely secondary pneumonia.  Patient did receive 1 shock due to possible VT/V. fib.  Troponins were flat.  CT on admission was  negative -Echo showed normal EF with no wall motion normalities and grade 1 diastolic dysfunction -Extubated 12/12/2018  #) COPD exacerbation -Continue LABA/ICS -Continue PRN bronchodilators -IV steroids per above  #) moderate to severe protein calorie malnutrition: Likely secondary to cancer -Nutrition consult -Continue mirtazapine 50 mg nightly  Fluids: Gentle IV fluids Electrolyte: Monitor and supplement Nutrition: pending diet modification per SPL  Prophylaxis: Subcu heparin   Disposition: initiated process for residential hospice referral; expecting discharge to hospice today or tomorrow.   DNR    Consultants:   PCCM  Oncology  Palliative care  Procedures:   Intubation 12/11/2018, extubation 12/12/2018   Echo 12/12/2018 - Procedure narrative: Transthoracic echocardiography. Image   quality was poor. The study was technically difficult, as a   result of poor acoustic windows, poor sound wave transmission,   and restricted patient mobility. - Left ventricle: The cavity size was normal. Wall thickness was   normal. Systolic function was normal. The estimated ejection   fraction was in the range of 50% to 55%. Wall motion was normal;   there were no regional wall motion abnormalities. Doppler   parameters are consistent with abnormal left ventricular   relaxation (grade 1 diastolic dysfunction). - Atrial septum: There was an atrial septal aneurysm. - Pulmonary arteries: Systolic pressure was moderately increased.   PA peak pressure: 57 mm Hg (S). - Pericardium, extracardiac: A small pericardial effusion was   identified.  Impressions:  - Normal LV systolic function; mild diastolic dysfunction; mild TR    with moderate pulmonary hypertension; small pericardial effusion.  Antimicrobials:  IV vancomycin 12/09/2020- 12/11/2018  IV cefepime 12/09/2020 ongoing   Subjective: This morning the patient reports that he is having some left-sided chest pain but  otherwise feels that  he is symptoms are well under control with hydromorphone.  He denies any nausea, vomiting, abdominal pain.  Objective: Vitals:   12/15/18 0800 12/15/18 1009 12/15/18 1200 12/15/18 2005  BP: 134/78  130/62 (!) 161/104  Pulse: (!) 102 (!) 104 (!) 104 (!) 118  Resp: 16 19 17 17   Temp: 97.6 F (36.4 C)   (!) 97.2 F (36.2 C)  TempSrc: Axillary   Oral  SpO2: 91% 90% 90% (!) 84%  Weight:      Height:        Intake/Output Summary (Last 24 hours) at 12/16/2018 0954 Last data filed at 12/16/2018 0236 Gross per 24 hour  Intake 567.1 ml  Output 700 ml  Net -132.9 ml   Filed Weights   12/09/18 1032 12/12/18 0458  Weight: 53.5 kg 57.3 kg    Examination:  General exam: Appears calm and comfortable; chronic ill appearance with cachexia Respiratory system: Mildly labored in breathing, diffuse rhonchi, no wheezes Cardiovascular system: Regular rate and rhythm, no murmurs Gastrointestinal system: Soft, nontender, no rebound or guarding, plus bowel sounds Central nervous system: Alert and oriented.  Grossly intact, moving all extremities Extremities: No lower extremity edema Skin: No rashes over visible skin Psychiatry: Judgement and insight appear normal. Mood & affect appropriate.     Data Reviewed: I have personally reviewed following labs and imaging studies  CBC: Recent Labs  Lab 12/09/18 1035 12/10/18 0709 12/11/18 1158 12/12/18 0316 12/13/18 0304 12/14/18 0230  WBC 12.8* 13.1* 22.2* 11.6* 17.4* 20.1*  NEUTROABS 10.0* 10.1*  --   --   --   --   HGB 14.2 13.0 13.6 11.9* 11.0* 11.6*  HCT 45.1 41.6 45.2 38.5* 35.5* 37.3*  MCV 95.3 98.8 100.9* 98.7 99.4 97.9  PLT 527* 428* 502* 337 295 585   Basic Metabolic Panel: Recent Labs  Lab 12/10/18 0709 12/11/18 1158 12/11/18 1201 12/12/18 0316 12/13/18 0304 12/14/18 0230  NA 138 139  --  139 141 138  K 4.2 4.5  --  4.6 4.2 4.2  CL 102 104  --  108 109 105  CO2 25 24  --  21* 22 23  GLUCOSE 103* 110*   --  150* 121* 122*  BUN 24* 27*  --  32* 34* 34*  CREATININE 0.62 0.87  --  0.67 0.53* 0.47*  CALCIUM 8.7* 8.9  --  8.3* 8.3* 8.1*  MG  --   --  2.2 2.0  --  2.2  PHOS  --   --  5.5*  --   --   --    GFR: Estimated Creatinine Clearance: 72.6 mL/min (A) (by C-G formula based on SCr of 0.47 mg/dL (L)). Liver Function Tests: Recent Labs  Lab 12/09/18 1035 12/11/18 1158 12/11/18 1201 12/12/18 0316 12/14/18 0230  AST 37 44* 43* 33 39  ALT 43 30 29 26 31   ALKPHOS 88 78 77 58 59  BILITOT 0.9 0.9 0.8 0.5 0.7  PROT 6.6 6.1* 6.1* 5.1* 5.3*  ALBUMIN 2.7* 2.6* 2.6* 2.1* 2.2*   No results for input(s): LIPASE, AMYLASE in the last 168 hours. No results for input(s): AMMONIA in the last 168 hours. Coagulation Profile: Recent Labs  Lab 12/11/18 1158  INR 1.27   Cardiac Enzymes: Recent Labs  Lab 12/11/18 1158 12/11/18 1808 12/12/18 0024  TROPONINI 0.05* 0.04* 0.03*   BNP (last 3 results) No results for input(s): PROBNP in the last 8760 hours. HbA1C: No results for input(s): HGBA1C in the last 72 hours. CBG:  Recent Labs  Lab 12/12/18 1937 12/12/18 2338 12/13/18 0419 12/13/18 0747 12/13/18 1138  GLUCAP 129* 97 120* 110* 120*   Lipid Profile: No results for input(s): CHOL, HDL, LDLCALC, TRIG, CHOLHDL, LDLDIRECT in the last 72 hours. Thyroid Function Tests: No results for input(s): TSH, T4TOTAL, FREET4, T3FREE, THYROIDAB in the last 72 hours. Anemia Panel: No results for input(s): VITAMINB12, FOLATE, FERRITIN, TIBC, IRON, RETICCTPCT in the last 72 hours. Sepsis Labs: Recent Labs  Lab 12/11/18 1158 12/11/18 1426  LATICACIDVEN 3.5* 3.1*    Recent Results (from the past 240 hour(s))  Culture, blood (routine x 2) Call MD if unable to obtain prior to antibiotics being given     Status: None   Collection Time: 12/09/18  5:10 PM  Result Value Ref Range Status   Specimen Description   Final    BLOOD LEFT FOREARM Performed at [facility], [place]  [address] [place], [place] [address]    Special Requests   Final    BOTTLES DRAWN AEROBIC AND ANAEROBIC Blood Culture adequate volume Performed at [place] [address] [place], [place] [address]    Culture   Final    NO GROWTH 5 DAYS Performed at [facility], [place] [address] [place], [place] [address]    Report Status 12/14/2018 FINAL  Final  Culture, sputum-assessment     Status: None   Collection Time: 12/09/18  5:10 PM  Result Value Ref Range Status   Specimen Description SPUTUM  Final   Special Requests Immunocompromised  Final   Sputum evaluation   Final    THIS SPECIMEN IS ACCEPTABLE FOR SPUTUM CULTURE Performed at [facility], [place] [address], [place], [place] [address]    Report Status 12/10/2018 FINAL  Final  Culture, respiratory     Status: None   Collection Time: 12/09/18  5:10 PM  Result Value Ref Range Status   Specimen Description   Final    SPUTUM Performed at [place] [address], [place], [place] [address]    Special Requests   Final    Immunocompromised Reflexed from [phone] Performed at [facility], [place] [address] [place], [place] [address]    Gram Stain   Final    ABUNDANT WBC PRESENT,BOTH PMN AND MONONUCLEAR RARE GRAM POSITIVE COCCI FEW GRAM VARIABLE ROD FEW YEAST Performed at [facility], [place] [address], [place], [place] [address]    Culture FEW CANDIDA TROPICALIS FEW CANDIDA ALBICANS   Final   Report Status 12/14/2018 FINAL  Final  Culture, blood (routine x 2) Call MD if unable to obtain prior to antibiotics being given     Status: None   Collection Time: 12/09/18  5:36 PM  Result Value Ref Range Status   Specimen Description   Final    BLOOD RIGHT ARM Performed at [facility], [place] [address] [place], [place] [address]    Special Requests   Final    BOTTLES DRAWN AEROBIC AND ANAEROBIC Blood Culture adequate  volume Performed at [place] [address] [place], [place] [address]    Culture   Final    NO GROWTH 5 DAYS Performed at [facility], [place] [address] [place], [place] [address]    Report Status 12/14/2018 FINAL  Final  MRSA PCR Screening     Status: None   Collection Time: 12/10/18  1:04 PM  Result Value Ref Range Status  MRSA by PCR NEGATIVE NEGATIVE Final    Comment:        The GeneXpert MRSA Assay (FDA approved for NASAL specimens only), is one component of a comprehensive MRSA colonization surveillance program. It is not intended to diagnose MRSA infection nor to guide or monitor treatment for MRSA infections. Performed at [facility], [place] [address] [place], [place] [address]          Radiology Studies: No results found.      Scheduled Meds: . arformoterol  15 mcg Nebulization BID  . budesonide (PULMICORT) nebulizer solution  0.5 mg Nebulization BID  . mouth rinse  15 mL Mouth Rinse BID  . methylPREDNISolone (SOLU-MEDROL) injection  40 mg Intravenous Q12H   Continuous Infusions: . sodium chloride 500 mL (12/15/18 1417)     LOS: 6 days    Time spent: [place], MD [facility]  If 7PM-7AM, please contact night-coverage [email or web address] Password [facility] 12/16/2018, 9:54 AM

## 2018-12-16 NOTE — Discharge Summary (Signed)
Physician Discharge Summary  Christopher Mora GBT:[id number] DOB: Jan 18, 1951 DOA: 12/09/2018  PCP: [name], MD  Admit date: 12/09/2018 Discharge date: 12/16/2018  Admitted From: Home Disposition:  Residential Hospice  Recommendations for Outpatient Follow-up:  1. Please talk to your hospice providers about any pain or shortness of breath he might be having.  Home Health: No Equipment/Devices:None  Discharge Condition: hospice CODE STATUS: comfort care Diet recommendation: regular  Brief/Interim Summary:  #) Stage IV adenocarcinoma of the lung: Patient has widespread metastatic disease to the spine, upper abdomen, thorax.  Patient was seen by oncology here and palliative care is recommended.  #) Acute hypoxic respiratory failure due to aspiration pneumonia due to pharyngeal dysphagia: Patient developed VT arrest and respiratory arrest likely due to aspiration pneumonia.  Patient was briefly intubated from 12/11/2018 to 12/12/2018.  Patient was noted to have large infiltrate on chest x-ray and grossly abnormal modified barium swallow with no ability to tolerate p.o. by mouth.  Patient did not want parenteral nutrition or a feeding tube.  Patient was maintained on IV antibiotics and IV steroids until discharge to hospice.  Blood cultures were negative.  Patient was seen by palliative care and opted for hospice.  Patient was started on  opiates for shortness of breath and pain.  #) Respiratory arrest/VT arrest: Patient did have respiratory/VT arrest on 12/11/2018 which immediately improved after shock.  Troponins were unremarkable.  CT on admission showed no evidence of pulmonary embolism.  Patient had an echo that showed only grade 1 diastolic dysfunction.  Patient was rapidly extubated.  #) COPD: Patient was continued on home LABA/ICS/scheduled bronchodilators and IV steroids.He may use as needed scheduled bronchodilators for shortness of breath.  #) Moderate to severe protein calorie  malnutrition: This was likely secondary to cancer.  Nutrition was consulted.  Patient was continued on mirtazapine.    Discharge Diagnoses:  Principal Problem:   Acute respiratory failure with hypoxia (HCC) Active Problems:   Pneumonia due to suspected gram-negative bacteria   Adenocarcinoma of lung, stage 4, right (HCC)   Metastasis to nervous system and eye (HCC)   Tobacco abuse, in remission   Cachexia (HCC)   Protein-calorie malnutrition, severe   Primary malignant neoplasm of lung metastatic to other site (HCC)   Leukocytosis, due to steroid   Dysphagia, pharyngoesophageal   COPD with acute exacerbation [facility])    Discharge Instructions  Discharge Instructions    Call MD for:  severe uncontrolled pain   Complete by:  As directed    Diet - low sodium heart healthy   Complete by:  As directed    Discharge instructions   Complete by:  As directed    Please ask your hospice providers about any pain or shortness of breath you might be having.   Increase activity slowly   Complete by:  As directed      Allergies as of 12/16/2018      Reactions   Oxycodone Nausea Only      Medication List    STOP taking these medications   ADVAIR DISKUS 250-50 MCG/DOSE Aepb Generic drug:  Fluticasone-Salmeterol   LORazepam 0.5 MG tablet Commonly known as:  ATIVAN   Melatonin 10 MG Tabs     TAKE these medications   PROAIR HFA 108 (90 Base) MCG/ACT inhaler Generic drug:  albuterol Inhale 2 puffs into the lungs every 6 (six) hours as needed for wheezing or shortness of breath.      Follow-up Information    Kathryne Eriksson  Lemmie Evens, MD.   Specialty:  Family Medicine Contact information: [address] [place] [address] [phone]          Allergies  Allergen Reactions  . Oxycodone Nausea Only    Consultations:  Oncology  Palliative care  Pulmonary critical care medicine   Procedures/Studies: Dg Chest 2 View  Result Date: 12/09/2018 CLINICAL DATA:  Cough  and shortness of breath. History of metastatic lung cancer. EXAM: CHEST - 2 VIEW COMPARISON:  Chest x-ray dated November 15, 2018. FINDINGS: The heart size and mediastinal contours are within normal limits. Normal pulmonary vascularity. Atherosclerotic calcification of the aortic arch. The lungs remain hyperinflated with emphysematous changes. Unchanged right suprahilar fullness. Progressive consolidation in the posterior right upper lobe. No pleural effusion or pneumothorax. No acute osseous abnormality. IMPRESSION: 1. Right upper lobe pneumonia, possibly postobstructive given right suprahilar fullness and history of metastatic lung cancer. 2. COPD. Electronically Signed   By: Titus Dubin M.D.   On: 12/09/2018 11:08   Dg Abd 1 View  Result Date: 12/11/2018 CLINICAL DATA:  Check gastric catheter placement EXAM: ABDOMEN - 1 VIEW COMPARISON:  None. FINDINGS: Gastric catheter is noted within the stomach. Scattered large and small bowel gas is noted. No acute bony abnormality is seen. IMPRESSION: Gastric catheter within the stomach. Electronically Signed   By: Inez Catalina M.D.   On: 12/11/2018 16:05   Ct Angio Chest Pe W And/or Wo Contrast  Result Date: 12/09/2018 CLINICAL DATA:  Weakness with choking and coughing when eating. Stage IV lung cancer. EXAM: CT ANGIOGRAPHY CHEST WITH CONTRAST TECHNIQUE: Multidetector CT imaging of the chest was performed using the standard protocol during bolus administration of intravenous contrast. Multiplanar CT image reconstructions and MIPs were obtained to evaluate the vascular anatomy. CONTRAST:  121mL ISOVUE-370 IOPAMIDOL (ISOVUE-370) INJECTION 76% COMPARISON:  Chest CT 11/15/2018. Radiographs 12/09/2018 and 11/15/2018. FINDINGS: Cardiovascular: The pulmonary arteries are well opacified with contrast to the level of the subsegmental branches. There is no evidence of acute pulmonary embolism. There is atherosclerosis of the aorta, great vessels and coronary arteries. The  heart size is normal. There is no pericardial effusion. Mediastinum/Nodes: There is confluent soft tissue in the mediastinum and hilar regions, suspicious for persistent lymphadenopathy.Right paratracheal (20 mm on image 39/4) and AP window (18 mm on image 49/4) components are similar to the previous study. Subcarinal component appears worse, measuring 25 mm on image 56/4. No axillary adenopathy. The thyroid gland, trachea and esophagus demonstrate no significant findings. Lungs/Pleura: There is no pleural effusion. Severe centrilobular and paraseptal emphysema again noted. As seen on recent radiographs, there is consolidation posteriorly in the right upper lobe, most consistent with pneumonia. In addition, there is progressive dependent airspace opacity in both lower lobes. In addition to these probable inflammatory changes, there are several enlarging nodules in the left lung, including a 2.1 x 2.0 cm upper lobe lesion on image 93/10, a 1.9 x 1.3 cm lesion along the inferior medial aspect of the left major fissure (image 145/10) and an 1.1 x 1.5 cm left lower lobe nodule on image 147/10. There are multiple other smaller nodules. A predominately linear 2.2 x 1.0 cm left upper lobe lesion on image 66/10 is stable, probably scarring. No suspicious nodule seen within the aerated portions of the right lung. Upper abdomen: Evaluation of the upper abdomen is limited by paucity of fat. There are bilateral adrenal masses suspicious for metastatic disease. Left adrenal mass measures 3.0 x 2.2 cm on image 114/4. Right  adrenal mass is less well-defined and difficult to separate from possible adjacent retroperitoneal adenopathy, measuring up to 4.1 x 3.4 cm on image 113/4. There is a soft tissue nodule posterior to the liver measuring 2.7 x 1.3 cm on image 107/4. No definite hepatic lesions seen. Musculoskeletal/Chest wall: There are multiple lytic osseous lesions consistent with metastatic disease. These are most prominent  within the T3 vertebral body (sagittal image 96/7), in the right 5th rib near the costovertebral junction and posteriorly in the right 8th rib. Several other smaller rib metastases are noted. No definite pathologic fracture or epidural tumor in the spine. Review of the MIP images confirms the above findings. IMPRESSION: 1. No evidence of acute pulmonary embolism. 2. Right upper lobe and bibasilar consolidation most consistent with pneumonia. 3. Progressive mediastinal lymphadenopathy, bilateral pulmonary nodularity and osseous metastatic disease consistent with stage IV lung cancer. In addition, there are bilateral adrenal nodules and increased nodularity in the upper abdomen, also worrisome for progressive metastatic disease. Electronically Signed   By: Richardean Sale M.D.   On: 12/09/2018 13:59   Dg Chest Port 1 View  Result Date: 12/13/2018 CLINICAL DATA:  Respiratory failure EXAM: PORTABLE CHEST 1 VIEW COMPARISON:  12/12/2018 FINDINGS: Cardiac shadow is stable. Aortic calcifications are again seen. Endotracheal tube and nasogastric catheter have been removed in the interval. Hyperinflation is again seen. Increasing infiltrate is noted particularly in the right upper lobe. The left lung is clear but hyperinflated. No bony abnormality is seen. IMPRESSION: Increasing right upper lobe infiltrate. Electronically Signed   By: Inez Catalina M.D.   On: 12/13/2018 07:07   Dg Chest Port 1 View  Result Date: 12/12/2018 CLINICAL DATA:  Respiratory failure. Endotracheal position assessment. EXAM: PORTABLE CHEST 1 VIEW COMPARISON:  12/11/2018 FINDINGS: Endotracheal tube tip is 6.6 cm above the carina. Nasogastric tube enters the stomach. Chronic lung disease persists, with persistent pneumonia right worse than left. No measurable effusion. IMPRESSION: Endotracheal tube tip 6.6 cm above the carina. Persistent pneumonia right more than left. Electronically Signed   By: Nelson Chimes M.D.   On: 12/12/2018 06:55   Dg  Chest Port 1 View  Result Date: 12/11/2018 CLINICAL DATA:  Cardiac arrest. EXAM: PORTABLE CHEST 1 VIEW COMPARISON:  Chest x-ray from same day at 2:45 a.m. FINDINGS: Interval placement of an endotracheal tube with the tip at the thoracic inlet, approximately 8.1 cm above the carina. The heart size and mediastinal contours are within normal limits. Normal pulmonary vascularity. The lungs remain hyperinflated with emphysematous changes. Unchanged consolidation in the right upper lobe. Unchanged patchy opacity at the right lung base. No pleural effusion or pneumothorax. No acute osseous abnormality. IMPRESSION: 1. Endotracheal tube at the thoracic inlet, 8.1 cm above the carina. Consider advancing 3 cm. 2. Unchanged right upper and lower lobe pneumonia. 3. COPD. Electronically Signed   By: Titus Dubin M.D.   On: 12/11/2018 12:38   Dg Chest Port 1 View  Result Date: 12/11/2018 CLINICAL DATA:  Shortness of breath EXAM: PORTABLE CHEST 1 VIEW COMPARISON:  Chest CT 12/09/2018 FINDINGS: There is consolidation in the right upper lobe, as demonstrated on the earlier CT. There is no pleural effusion or pneumothorax. Cardiomediastinal size is normal. IMPRESSION: Right upper lobe pneumonia, unchanged. Electronically Signed   By: Ulyses Jarred M.D.   On: 12/11/2018 03:10   Dg Swallowing Func-speech Pathology  Result Date: 12/13/2018 Objective Swallowing Evaluation: Type of Study: MBS-Modified Barium Swallow Study  Patient Details Name: Deiontae Rabel MRN: [id number] Date of  Birth: 1951/04/28 Today's Date: 12/13/2018 Time: SLP Start Time (ACUTE ONLY): 0900 -SLP Stop Time (ACUTE ONLY): 0935 SLP Time Calculation (min) (ACUTE ONLY): 35 min Past Medical History: Past Medical History: Diagnosis Date . Asthma  . Cancer ([place])  . Cough "for a while"  nonproductive . Pneumonia dec 2012 . Shortness of breath   occasional Past Surgical History: Past Surgical History: Procedure Laterality Date . APPENDECTOMY  04/15/2012  Procedure:  APPENDECTOMY;  Surgeon: Edward Jolly, MD;  Location: [facility];  Service: General;; . CATARACT EXTRACTION   . COLON SURGERY   . COLOSTOMY  04/15/2012  Procedure: COLOSTOMY;  Surgeon: Edward Jolly, MD;  Location: [facility];  Service: General;; . COLOSTOMY TAKEDOWN  12/23/2012  Procedure: COLOSTOMY TAKEDOWN;  Surgeon: Edward Jolly, MD;  Location: [facility];  Service: General;  Laterality: N/A;  TAKEDOWN OF HARTMAN COLOSTOMY  . INTRAOCULAR LENS IMPLANT, SECONDARY   . KNEE SURGERY  1973  Left . KNEE SURGERY   . LAPAROTOMY  04/15/2012  Procedure: EXPLORATORY LAPAROTOMY;  Surgeon: Edward Jolly, MD;  Location: [facility];  Service: General;  Laterality: N/A; . PARTIAL COLECTOMY  04/15/2012  Procedure: PARTIAL COLECTOMY;  Surgeon: Edward Jolly, MD;  Location: [facility];  Service: General;;  sigmoid colectomy . SHOULDER SURGERY  2009 . SHOULDER SURGERY   HPI: 68 yo male admitted to [facility] with respiratory distress.  Pt PMH + for Stage IV lung cancer = diagnosed with right upper lobe pna = respiratory and cardiac arrest.   Subjective: pt awake in chair Assessment / Plan / Recommendation CHL IP CLINICAL IMPRESSIONS 12/13/2018 Clinical Impression Pt presents with mild oral and gross pharyngeal=cervical esophageal dysphagia characterized by minimal motility resulting in gross residuals across all consistencies.   Pt was only given few boluses of liquids and single bolus of pudding due to level of dysphagia.  Residuals predominantly vallecular region due to very poor tongue base retraction and epiglottic deflection with impaired sensation.  SLP questions primarily vagal and hypoglossal neve involvements - ? compression on recurrent laryngeal nerve from mass?  Pt only transited approximately 10% of boluses into esophagus - large amount retained in pharynx/vallecular region precariously on top of the epiglottis.  Cued "hock" helpful for pt to expectorate but fatigued pt excessively.   He appeared more dyspneic today and  he admitted this to be accurate.  Pt did not aspirate but given very poor pharyngeal motility = he is certainly aspirating even secretions at this time.  Multiple cued swallows with chin tuck helpful to decrease pyriform sinus residuals of liquids and secretions but were exhausting for pt to perform and did not clear vallecular region.  This pt's swallow is grossly impaired which prohibits him from nutritional support.  Note possible hospice referral - Recommend prior to goals of care, pt be allowed single SMALL ice chips - that he allows to melt fully with chin tuck posture conducting multiple swallows.  Ice chips willl enhance oral care and hopefully help to aid clearance of pharyngeal secretions.  If pt desires po with accepted risks, liquids may be most comforting for him given gross residuals and risk of aspirating solids.  Will follow up for education with this pt.   SLP Visit Diagnosis Dysphagia, oropharyngeal phase (R13.12);Dysphagia, pharyngoesophageal phase (R13.14) Attention and concentration deficit following -- Frontal lobe and executive function deficit following -- Impact on safety and function Risk for inadequate nutrition/hydration;Severe aspiration risk   CHL IP TREATMENT RECOMMENDATION 12/13/2018 Treatment Recommendations Therapy as outlined in  treatment plan below   Prognosis 12/13/2018 Prognosis for Safe Diet Advancement Guarded Barriers to Reach Goals Severity of deficits;Time post onset;Other (Comment) Barriers/Prognosis Comment -- CHL IP DIET RECOMMENDATION 12/13/2018 SLP Diet Recommendations Ice chips PRN after oral care Liquid Administration via -- Medication Administration -- Compensations Chin tuck;Multiple dry swallows after each bite/sip Postural Changes Remain semi-upright after after feeds/meals (Comment);Seated upright at 90 degrees   CHL IP OTHER RECOMMENDATIONS 12/13/2018 Recommended Consults -- Oral Care Recommendations Oral care QID Other Recommendations --   CHL IP FOLLOW UP  RECOMMENDATIONS 12/12/2018 Follow up Recommendations (No Data)   CHL IP FREQUENCY AND DURATION 12/13/2018 Speech Therapy Frequency (ACUTE ONLY) min 1 x/week Treatment Duration 1 week;2 weeks      CHL IP ORAL PHASE 12/13/2018 Oral Phase Impaired Oral - Pudding Teaspoon -- Oral - Pudding Cup -- Oral - Honey Teaspoon -- Oral - Honey Cup -- Oral - Nectar Teaspoon Weak lingual manipulation Oral - Nectar Cup -- Oral - Nectar Straw Weak lingual manipulation Oral - Thin Teaspoon Weak lingual manipulation Oral - Thin Cup -- Oral - Thin Straw Weak lingual manipulation Oral - Puree Weak lingual manipulation Oral - Mech Soft Weak lingual manipulation Oral - Regular -- Oral - Multi-Consistency -- Oral - Pill -- Oral Phase - Comment --  CHL IP PHARYNGEAL PHASE 12/13/2018 Pharyngeal Phase Impaired Pharyngeal- Pudding Teaspoon -- Pharyngeal -- Pharyngeal- Pudding Cup -- Pharyngeal -- Pharyngeal- Honey Teaspoon -- Pharyngeal -- Pharyngeal- Honey Cup -- Pharyngeal -- Pharyngeal- Nectar Teaspoon Reduced epiglottic inversion;Reduced anterior laryngeal mobility;Reduced laryngeal elevation;Reduced airway/laryngeal closure;Reduced tongue base retraction;Pharyngeal residue - valleculae;Pharyngeal residue - pyriform;Pharyngeal residue - cp segment;Compensatory strategies attempted (with notebox) Pharyngeal -- Pharyngeal- Nectar Cup -- Pharyngeal -- Pharyngeal- Nectar Straw Reduced epiglottic inversion;Reduced pharyngeal peristalsis;Reduced laryngeal elevation;Reduced anterior laryngeal mobility;Reduced airway/laryngeal closure;Reduced tongue base retraction;Pharyngeal residue - pyriform;Pharyngeal residue - valleculae Pharyngeal -- Pharyngeal- Thin Teaspoon Reduced pharyngeal peristalsis;Reduced epiglottic inversion;Reduced anterior laryngeal mobility;Reduced laryngeal elevation;Reduced airway/laryngeal closure;Reduced tongue base retraction;Pharyngeal residue - valleculae Pharyngeal -- Pharyngeal- Thin Cup -- Pharyngeal -- Pharyngeal- Thin  Straw Reduced epiglottic inversion;Reduced anterior laryngeal mobility;Reduced laryngeal elevation;Reduced airway/laryngeal closure;Reduced tongue base retraction;Pharyngeal residue - valleculae;Pharyngeal residue - pyriform Pharyngeal -- Pharyngeal- Puree Reduced laryngeal elevation;Reduced anterior laryngeal mobility;Reduced epiglottic inversion;Reduced pharyngeal peristalsis;Reduced airway/laryngeal closure;Reduced tongue base retraction;Pharyngeal residue - valleculae;Pharyngeal residue - pyriform;Pharyngeal residue - posterior pharnyx Pharyngeal -- Pharyngeal- Mechanical Soft -- Pharyngeal -- Pharyngeal- Regular -- Pharyngeal -- Pharyngeal- Multi-consistency -- Pharyngeal -- Pharyngeal- Pill -- Pharyngeal -- Pharyngeal Comment chin tuck posture minimally helpful, cued dry swallows and "hock" were exhausting for pt but inconsistently helpful, pt able to propel 50% of pudding residuals from vallecular region into oral cavity to orally suction, chin tuck posture with head turn right/left not helpful but chin tuck alone marginally helpful with liquids  CHL IP CERVICAL ESOPHAGEAL PHASE 12/13/2018 Cervical Esophageal Phase Impaired Pudding Teaspoon -- Pudding Cup -- Honey Teaspoon -- Honey Cup -- Nectar Teaspoon -- Nectar Cup -- Nectar Straw -- Thin Teaspoon -- Thin Cup -- Thin Straw -- Puree -- Mechanical Soft -- Regular -- Multi-consistency -- Pill -- Cervical Esophageal Comment very poor cricophayrngeus opening/relaxation resulting in gross residuals that mixed with secretions Macario Golds 12/13/2018, 10:03 AM Luanna Salk, MS [facility] SLP Acute Rehab Services Pager [phone] Office [phone]                Intubation 12/11/2018, extubation 12/12/2018   Echo 12/12/2018 - Procedure narrative: Transthoracic echocardiography. Image quality was poor. The study was technically difficult, as a  result of poor acoustic windows, poor sound wave transmission, and restricted patient mobility. - Left  ventricle: The cavity size was normal. Wall thickness was normal. Systolic function was normal. The estimated ejection fraction was in the range of 50% to 55%. Wall motion was normal; there were no regional wall motion abnormalities. Doppler parameters are consistent with abnormal left ventricular relaxation (grade 1 diastolic dysfunction). - Atrial septum: There was an atrial septal aneurysm. - Pulmonary arteries: Systolic pressure was moderately increased. PA peak pressure: 57 mm Hg (S). - Pericardium, extracardiac: A small pericardial effusion was identified.  Impressions:  - Normal LV systolic function; mild diastolic dysfunction; mild TR  with moderate pulmonary hypertension; small pericardial effusion.   Subjective:   Discharge Exam: Vitals:   12/15/18 1200 12/15/18 2005  BP: 130/62 (!) 161/104  Pulse: (!) 104 (!) 118  Resp: 17 17  Temp:  (!) 97.2 F (36.2 C)  SpO2: 90% (!) 84%   Vitals:   12/15/18 0800 12/15/18 1009 12/15/18 1200 12/15/18 2005  BP: 134/78  130/62 (!) 161/104  Pulse: (!) 102 (!) 104 (!) 104 (!) 118  Resp: 16 19 17 17   Temp: 97.6 F (36.4 C)   (!) 97.2 F (36.2 C)  TempSrc: Axillary   Oral  SpO2: 91% 90% 90% (!) 84%  Weight:      Height:       General exam: Appears calm and comfortable; chronic ill appearance with cachexia Respiratory system: Mildly labored in breathing, diffuse rhonchi, no wheezes Cardiovascular system: Regular rate and rhythm, no murmurs Gastrointestinal system: Soft, nontender, no rebound or guarding, plus bowel sounds Central nervous system: Alert and oriented.  Grossly intact, moving all extremities Extremities: No lower extremity edema Skin: No rashes over visible skin Psychiatry: Judgement and insight appear normal. Mood & affect appropriate.    The results of significant diagnostics from this hospitalization (including imaging, microbiology, ancillary and laboratory) are listed below for  reference.     Microbiology: Recent Results (from the past 240 hour(s))  Culture, blood (routine x 2) Call MD if unable to obtain prior to antibiotics being given     Status: None   Collection Time: 12/09/18  5:10 PM  Result Value Ref Range Status   Specimen Description   Final    BLOOD LEFT FOREARM Performed at [facility], [place] [address], [place], [place] [address]    Special Requests   Final    BOTTLES DRAWN AEROBIC AND ANAEROBIC Blood Culture adequate volume Performed at [place] [address], [place], [place] [address]    Culture   Final    NO GROWTH 5 DAYS Performed at [facility], [place] [address], [place], [place] [address]    Report Status 12/14/2018 FINAL  Final  Culture, sputum-assessment     Status: None   Collection Time: 12/09/18  5:10 PM  Result Value Ref Range Status   Specimen Description SPUTUM  Final   Special Requests Immunocompromised  Final   Sputum evaluation   Final    THIS SPECIMEN IS ACCEPTABLE FOR SPUTUM CULTURE Performed at [facility], [place] [address] [place], [place] [address]    Report Status 12/10/2018 FINAL  Final  Culture, respiratory     Status: None   Collection Time: 12/09/18  5:10 PM  Result Value Ref Range Status   Specimen Description   Final    SPUTUM Performed at [place] [address] [place], [place]  [address]    Special Requests   Final    Immunocompromised Reflexed from [id number] Performed at [facility], [place] [address] [place], [place] [address]    Gram Stain   Final    ABUNDANT WBC PRESENT,BOTH PMN AND MONONUCLEAR RARE GRAM POSITIVE COCCI FEW GRAM VARIABLE ROD FEW YEAST Performed at [facility], [place] [address] [place], [place] [address]    Culture FEW CANDIDA TROPICALIS FEW CANDIDA ALBICANS   Final   Report Status 12/14/2018 FINAL  Final  Culture, blood (routine x 2) Call MD if unable to obtain  prior to antibiotics being given     Status: None   Collection Time: 12/09/18  5:36 PM  Result Value Ref Range Status   Specimen Description   Final    BLOOD RIGHT ARM Performed at [facility], [place] [address] [place], [place] [address]    Special Requests   Final    BOTTLES DRAWN AEROBIC AND ANAEROBIC Blood Culture adequate volume Performed at [place] [address] [place], [place] [address]    Culture   Final    NO GROWTH 5 DAYS Performed at [facility], [place] [address] [place], [place] [address]    Report Status 12/14/2018 FINAL  Final  MRSA PCR Screening     Status: None   Collection Time: 12/10/18  1:04 PM  Result Value Ref Range Status   MRSA by PCR NEGATIVE NEGATIVE Final    Comment:        The GeneXpert MRSA Assay (FDA approved for NASAL specimens only), is one component of a comprehensive MRSA colonization surveillance program. It is not intended to diagnose MRSA infection nor to guide or monitor treatment for MRSA infections. Performed at [facility], [place] [address], [place],  [address]      Labs: BNP (last 3 results) Recent Labs    01/12/18 1930  BNP 324.4*   Basic Metabolic Panel: Recent Labs  Lab 12/10/18 0709 12/11/18 1158 12/11/18 1201 12/12/18 0316 12/13/18 0304 12/14/18 0230  NA 138 139  --  139 141 138  K 4.2 4.5  --  4.6 4.2 4.2  CL 102 104  --  108 109 105  CO2 25 24  --  21* 22 23  GLUCOSE 103* 110*  --  150* 121* 122*  BUN 24* 27*  --  32* 34* 34*  CREATININE 0.62 0.87  --  0.67 0.53* 0.47*  CALCIUM 8.7* 8.9  --  8.3* 8.3* 8.1*  MG  --   --  2.2 2.0  --  2.2  PHOS  --   --  5.5*  --   --   --    Liver Function Tests: Recent Labs  Lab 12/11/18 1158 12/11/18 1201 12/12/18 0316 12/14/18 0230  AST 44* 43* 33 39  ALT 30 29 26 31   ALKPHOS 78 77 58 59  BILITOT 0.9 0.8 0.5 0.7  PROT 6.1* 6.1* 5.1* 5.3*  ALBUMIN 2.6* 2.6* 2.1* 2.2*   No results for  input(s): LIPASE, AMYLASE in the last 168 hours. No results for input(s): AMMONIA in the last 168 hours. CBC: Recent Labs  Lab 12/10/18 0709 12/11/18 1158 12/12/18 0316 12/13/18 0304 12/14/18 0230  WBC 13.1* 22.2* 11.6* 17.4* 20.1*  NEUTROABS 10.1*  --   --   --   --   HGB 13.0 13.6 11.9* 11.0* 11.6*  HCT 41.6 45.2 38.5* 35.5* 37.3*  MCV 98.8  100.9* 98.7 99.4 97.9  PLT 428* 502* 337 295 329   Cardiac Enzymes: Recent Labs  Lab 12/11/18 1158 12/11/18 1808 12/12/18 0024  TROPONINI 0.05* 0.04* 0.03*   BNP: Invalid input(s): POCBNP CBG: Recent Labs  Lab 12/12/18 1937 12/12/18 2338 12/13/18 0419 12/13/18 0747 12/13/18 1138  GLUCAP 129* 97 120* 110* 120*   D-Dimer No results for input(s): DDIMER in the last 72 hours. Hgb A1c No results for input(s): HGBA1C in the last 72 hours. Lipid Profile No results for input(s): CHOL, HDL, LDLCALC, TRIG, CHOLHDL, LDLDIRECT in the last 72 hours. Thyroid function studies No results for input(s): TSH, T4TOTAL, T3FREE, THYROIDAB in the last 72 hours.  Invalid input(s): FREET3 Anemia work up No results for input(s): VITAMINB12, FOLATE, FERRITIN, TIBC, IRON, RETICCTPCT in the last 72 hours. Urinalysis    Component Value Date/Time   COLORURINE AMBER (A) 04/15/2012 1653   APPEARANCEUR CLOUDY (A) 04/15/2012 1653   LABSPEC 1.024 04/15/2012 1653   PHURINE 6.0 04/15/2012 1653   GLUCOSEU NEGATIVE 04/15/2012 1653   HGBUR NEGATIVE 04/15/2012 1653   BILIRUBINUR SMALL (A) 04/15/2012 1653   KETONESUR NEGATIVE 04/15/2012 1653   PROTEINUR NEGATIVE 04/15/2012 1653   UROBILINOGEN 1.0 04/15/2012 1653   NITRITE NEGATIVE 04/15/2012 1653   LEUKOCYTESUR TRACE (A) 04/15/2012 1653   Sepsis Labs Invalid input(s): PROCALCITONIN,  WBC,  LACTICIDVEN Microbiology Recent Results (from the past 240 hour(s))  Culture, blood (routine x 2) Call MD if unable to obtain prior to antibiotics being given     Status: None   Collection Time: 12/09/18  5:10 PM   Result Value Ref Range Status   Specimen Description   Final    BLOOD LEFT FOREARM Performed at [facility], [place] [address], [place], [place] [address]    Special Requests   Final    BOTTLES DRAWN AEROBIC AND ANAEROBIC Blood Culture adequate volume Performed at [place] [address], [place], [place] [address]    Culture   Final    NO GROWTH 5 DAYS Performed at [facility], [place] [address], [place], [place] [address]    Report Status 12/14/2018 FINAL  Final  Culture, sputum-assessment     Status: None   Collection Time: 12/09/18  5:10 PM  Result Value Ref Range Status   Specimen Description SPUTUM  Final   Special Requests Immunocompromised  Final   Sputum evaluation   Final    THIS SPECIMEN IS ACCEPTABLE FOR SPUTUM CULTURE Performed at [facility], [place] [address] [place], [place] [address]    Report Status 12/10/2018 FINAL  Final  Culture, respiratory     Status: None   Collection Time: 12/09/18  5:10 PM  Result Value Ref Range Status   Specimen Description   Final    SPUTUM Performed at [place] [address], [place], [place] [address]    Special Requests   Final    Immunocompromised Reflexed from [phone] Performed at [facility], [place] [address] [place], [place] [address]    Gram Stain   Final    ABUNDANT WBC PRESENT,BOTH PMN AND MONONUCLEAR RARE GRAM POSITIVE COCCI FEW GRAM VARIABLE ROD FEW YEAST Performed at [facility], [place] [address] [place], [place] [address]    Culture FEW CANDIDA TROPICALIS FEW CANDIDA ALBICANS   Final   Report Status 12/14/2018 FINAL  Final  Culture, blood (routine x 2) Call MD if unable to obtain prior to antibiotics  being given     Status: None   Collection Time: 12/09/18  5:36 PM  Result Value Ref Range Status   Specimen Description   Final    BLOOD RIGHT ARM Performed at [place] [address], [place], [place] [address]    Special Requests   Final    BOTTLES DRAWN AEROBIC AND ANAEROBIC Blood Culture adequate volume Performed at [place] [address] [place], [place] [address]    Culture   Final    NO GROWTH 5 DAYS Performed at [facility], [place] [address], [place], [place] [address]    Report Status 12/14/2018 FINAL  Final  MRSA PCR Screening     Status: None   Collection Time: 12/10/18  1:04 PM  Result Value Ref Range Status   MRSA by PCR NEGATIVE NEGATIVE Final    Comment:        The GeneXpert MRSA Assay (FDA approved for NASAL specimens only), is one component of a comprehensive MRSA colonization surveillance program. It is not intended to diagnose MRSA infection nor to guide or monitor treatment for MRSA infections. Performed at [facility], [place] [address] [place], [place] [address]      Time coordinating discharge: 7  SIGNED:   Cristy Folks, MD  [facility] 12/16/2018, 10:49 AM  If 7PM-7AM, please contact night-coverage [email or web address] Password [email or web address]

## 2018-12-16 NOTE — Progress Notes (Signed)
Nutrition Brief Note  Patient last seen by this RD on 1/16. Chart reviewed. Pt now transitioning to comfort care with plan to transfer to [facility] today.  No further nutrition interventions warranted at this time.  Please re-consult as needed.     Jarome Matin, MS, RD, LDN, [facility] Inpatient Clinical Dietitian Pager # [phone] After hours/weekend pager # [phone]

## 2018-12-16 NOTE — Progress Notes (Signed)
[facility] available for Christopher Mora today. Met with him, son Christopher Mora and Andrew's mother Christopher Mora earlier this morning to complete paper work for transfer to [facility] today. Made RN and CSW aware.    Discharge summary has been sent.   RN please call report to [phone].  Thank you,  Erling Conte, LCSW [phone]

## 2018-12-28 DEATH — deceased
